# Patient Record
Sex: Male | Born: 1990 | Race: Black or African American | Hispanic: No | Marital: Single | Smoking: Current every day smoker
Health system: Southern US, Community
[De-identification: ages and names within clinical notes are randomized; demographics above are authoritative.]

## PROBLEM LIST (undated history)

## (undated) DIAGNOSIS — J45909 Unspecified asthma, uncomplicated: Secondary | ICD-10-CM

---

## 2017-07-12 ENCOUNTER — Other Ambulatory Visit: Payer: Self-pay

## 2017-07-12 ENCOUNTER — Encounter (HOSPITAL_BASED_OUTPATIENT_CLINIC_OR_DEPARTMENT_OTHER): Payer: Self-pay | Admitting: Emergency Medicine

## 2017-07-12 ENCOUNTER — Emergency Department (HOSPITAL_BASED_OUTPATIENT_CLINIC_OR_DEPARTMENT_OTHER): Payer: Self-pay

## 2017-07-12 ENCOUNTER — Emergency Department (HOSPITAL_BASED_OUTPATIENT_CLINIC_OR_DEPARTMENT_OTHER)
Admission: EM | Admit: 2017-07-12 | Discharge: 2017-07-12 | Disposition: A | Discharge status: Home / Self Care | Payer: Self-pay | Attending: Emergency Medicine | Admitting: Emergency Medicine

## 2017-07-12 DIAGNOSIS — F17200 Nicotine dependence, unspecified, uncomplicated: Secondary | ICD-10-CM | POA: Insufficient documentation

## 2017-07-12 DIAGNOSIS — J4521 Mild intermittent asthma with (acute) exacerbation: Secondary | ICD-10-CM | POA: Insufficient documentation

## 2017-07-12 HISTORY — DX: Unspecified asthma, uncomplicated: J45.909

## 2017-07-12 MED ORDER — ALBUTEROL (5 MG/ML) CONTINUOUS INHALATION SOLN
INHALATION_SOLUTION | RESPIRATORY_TRACT | Status: AC
  Administered 2017-07-12: 3 mg/h
  Filled 2017-07-12: qty 20

## 2017-07-12 MED ORDER — MAGNESIUM SULFATE 2 GM/50ML IV SOLN
2.0000 g | Freq: Once | INTRAVENOUS | Status: AC
  Administered 2017-07-12: 2 g via INTRAVENOUS
  Filled 2017-07-12: qty 50

## 2017-07-12 MED ORDER — ALBUTEROL SULFATE HFA 108 (90 BASE) MCG/ACT IN AERS
1.0000 | INHALATION_SPRAY | Freq: Once | RESPIRATORY_TRACT | Status: AC
  Administered 2017-07-12: 1 via RESPIRATORY_TRACT
  Filled 2017-07-12: qty 6.7

## 2017-07-12 MED ORDER — IPRATROPIUM-ALBUTEROL 0.5-2.5 (3) MG/3ML IN SOLN: 3.0000 mL | Freq: Once | RESPIRATORY_TRACT | Status: DC

## 2017-07-12 MED ORDER — ALBUTEROL SULFATE HFA 108 (90 BASE) MCG/ACT IN AERS
INHALATION_SPRAY | RESPIRATORY_TRACT | Status: AC
  Filled 2017-07-12: qty 6.7

## 2017-07-12 MED ORDER — PREDNISONE 20 MG PO TABS: 40.0000 mg | ORAL_TABLET | Freq: Every day | ORAL | 0 refills | Status: DC

## 2017-07-12 MED ORDER — BENZONATATE 100 MG PO CAPS: 100.0000 mg | ORAL_CAPSULE | Freq: Three times a day (TID) | ORAL | 0 refills | Status: DC

## 2017-07-12 MED ORDER — IPRATROPIUM BROMIDE 0.02 % IN SOLN
RESPIRATORY_TRACT | Status: AC
  Administered 2017-07-12: 0.5 mg
  Filled 2017-07-12: qty 2.5

## 2017-07-12 MED ORDER — PREDNISONE 50 MG PO TABS
60.0000 mg | ORAL_TABLET | Freq: Once | ORAL | Status: AC
  Administered 2017-07-12: 60 mg via ORAL
  Filled 2017-07-12: qty 1

## 2017-07-12 MED ORDER — SODIUM CHLORIDE 0.9 % IV BOLUS
500.0000 mL | Freq: Once | INTRAVENOUS | Status: AC
  Administered 2017-07-12: 500 mL via INTRAVENOUS

## 2017-07-12 MED FILL — BENZONATATE 100 MG CAPSULE: 100 | 5 days supply | Qty: 15 | Fill #0

## 2017-07-12 MED FILL — predniSONE 20 MG TABS: 20 | 4 days supply | Qty: 8 | Fill #0

## 2017-07-12 NOTE — ED Notes (Signed)
Patient transported to X-ray 

## 2017-07-12 NOTE — ED Provider Notes (Addendum)
[facility] EMERGENCY DEPARTMENT Provider Note   CSN: [id number] Arrival date & time: 07/12/17  0930     History   Chief Complaint Chief Complaint  Patient presents with  . Shortness of Breath    HPI Jeff Savage is a 27 y.o. male.  HPI 27 yo male with pmh for asthma presents to the emergency department today for evaluation of asthma exacerbation.  Patient reports chest tightness, shortness of breath, wheezing.  He states for the past 2 months his asthma has been worse however increasingly got worse over the past 2 weeks.  Patient reports using his nebs every hour at rest and 4 times per night.  Patient states that he has had use inhaler every 10 to 20 minutes while at work.  Reports half pack a day smoker.  Also reports that pollen makes his asthma worse.  States he was seen in the ER 1 month ago for an asthma exacerbation treated with steroids which helped.  Patient reports associated congestion, rhinorrhea and productive cough of yellow-green sputum.  Denies any associated hemoptysis, shortness of breath at rest.  He has no primary care doctor that treats him for his asthma.  Patient was treated with a continuous albuterol inhaler prior to my examination states that his breathing has improved.  Patient denies any associated fevers or chills.  No known sick contacts.  Pt denies any fever, chill, ha, vision changes, lightheadedness, dizziness,neck pain, cp, abd pain, n/v/d, urinary symptoms, change in bowel habits, melena, hematochezia, lower extremity paresthesias.   Past Medical History:  Diagnosis Date  . Asthma     There are no active problems to display for this patient.   History reviewed. No pertinent surgical history.      Home Medications    Prior to Admission medications   Not on File    Family History No family history on file.  Social History Social History   Tobacco Use  . Smoking status: Current Every Day Smoker  . Smokeless tobacco:  Never Used  Substance Use Topics  . Alcohol use: Never    Frequency: Never  . Drug use: Never     Allergies   Patient has no known allergies.   Review of Systems Review of Systems  All other systems reviewed and are negative.    Physical Exam Updated Vital Signs BP (!) 144/97 (BP Location: Right Arm)   Pulse 97   Temp 99.6 F (37.6 C) (Oral)   Resp 16   Ht  (1.753 m)   Wt 87.5 kg (193 lb)   SpO2 96%   BMI 28.50 kg/m   Physical Exam  Constitutional: He is oriented to person, place, and time. He appears well-developed and well-nourished.  Non-toxic appearance. No distress.  HENT:  Head: Normocephalic and atraumatic.  Nose: Nose normal.  Mouth/Throat: Oropharynx is clear and moist.  Eyes: Pupils are equal, round, and reactive to light. Conjunctivae are normal. Right eye exhibits no discharge. Left eye exhibits no discharge.  Neck: Normal range of motion. Neck supple. No JVD present. No tracheal deviation present.  Cardiovascular: Normal rate, regular rhythm, normal heart sounds and intact distal pulses.  Pulmonary/Chest: Effort normal. No accessory muscle usage. No tachypnea. No respiratory distress. He has wheezes. He exhibits no tenderness.  No increased work of breathing.  No significant tachypnea noted.  Abdominal: Soft. Bowel sounds are normal. He exhibits no distension. There is no tenderness. There is no rebound and no guarding.  Musculoskeletal: Normal range of  motion.       Right lower leg: Normal. He exhibits no tenderness and no edema.       Left lower leg: Normal. He exhibits no tenderness and no edema.  No lower extremity edema or calf tenderness.  Lymphadenopathy:    He has no cervical adenopathy.  Neurological: He is alert and oriented to person, place, and time.  Skin: Skin is warm and dry. Capillary refill takes less than 2 seconds. He is not diaphoretic.  Psychiatric: His behavior is normal. Judgment and thought content normal.  Nursing note and  vitals reviewed.    ED Treatments / Results  Labs (all labs ordered are listed, but only abnormal results are displayed) Labs Reviewed - No data to display  EKG None  Radiology Dg Chest 2 View  Result Date: 07/12/2017 CLINICAL DATA:  Asthma EXAM: CHEST - 2 VIEW COMPARISON:  03/22/2017 FINDINGS: Heart and mediastinal contours are within normal limits. No focal opacities or effusions. No acute bony abnormality. IMPRESSION: No active cardiopulmonary disease. Electronically Signed   By: Charlett Nose M.D.   On: 07/12/2017 11:13    Procedures Procedures (including critical care time)  Medications Ordered in ED Medications  ipratropium-albuterol (DUONEB) 0.5-2.5 (3) MG/3ML nebulizer solution 3 mL (3 mLs Nebulization Not Given 07/12/17 1110)  magnesium sulfate IVPB 2 g 50 mL (has no administration in time range)  sodium chloride 0.9 % bolus 500 mL (has no administration in time range)  ipratropium (ATROVENT) 0.02 % nebulizer solution (0.5 mg  Given by Other 07/12/17 0942)  albuterol (PROVENTIL, VENTOLIN) (5 MG/ML) 0.5% continuous inhalation solution (3 mg/hr  Given 07/12/17 0942)  predniSONE (DELTASONE) tablet 60 mg (60 mg Oral Given 07/12/17 1106)     Initial Impression / Assessment and Plan / ED Course  I have reviewed the triage vital signs and the nursing notes.  Pertinent labs & imaging results that were available during my care of the patient were reviewed by me and considered in my medical decision making (see chart for details).     Patient ambulated in ED with O2 saturations maintained >90, no current signs of respiratory distress. Lung exam improved after nebulizer treatment. Prednisone given in the ED and pt will bd dc with 5 day burst. Pt states they are breathing at baseline. Pt has been instructed to continue using prescribed medications and to speak with PCP about today's exacerbation.   Pt is hemodynamically stable, in NAD, & able to ambulate in the ED. Evaluation does  not show pathology that would require ongoing emergent intervention or inpatient treatment.  Presentation does not seem consistent with PE, dissection, ACS, pneumonia.  X-ray notes findings for acute infiltrate concerning for pneumonia.  I explained the diagnosis to the patient. Pain has been managed & has no complaints prior to dc. Pt is comfortable with above plan and is stable for discharge at this time. All questions were answered prior to disposition. Strict return precautions for f/u to the ED were discussed. Encouraged follow up with PCP.    Final Clinical Impressions(s) / ED Diagnoses   Final diagnoses:  Mild intermittent asthma with exacerbation    ED Discharge Orders        Ordered    predniSONE (DELTASONE) 20 MG tablet  Daily with breakfast     07/12/17 1340    benzonatate (TESSALON) 100 MG capsule  Every 8 hours     07/12/17 1340       Rise Mu, [place] 07/12/17 1342  Rise Mu, PA-C 07/12/17 1343    Rolan Bucco, MD 07/12/17 [phone]

## 2017-07-12 NOTE — ED Notes (Signed)
Pt ambulated here in the ED with any respiratory distress noted. Patient laughing and talking with no increase WOB noted. HR 107, RR 18, SpO2 between 94-97% on RA. Pt stated "im feeling better and ready to go home".

## 2017-07-12 NOTE — ED Notes (Signed)
  Albuterol/ 0.5mg  Atrovent Continuous complete.

## 2017-07-12 NOTE — Discharge Instructions (Addendum)
You have been treated for an asthma attack in the ED. Take prednisone daily for 4 days starting tomorrow morning.   I have given you a refill of your home inhaler.   You may use the tessalon for cough.   Warm steam from a hot shower will help your symptoms.   Follow up with your primary care provider for further discussion of today's ER visit.   If you develop any new or worsening symptoms, including but not limited to fever, persistent vomiting, worsening shortness of breath or other symptoms that concern you, please return to the Emergency Department immediately.  

## 2017-07-12 NOTE — ED Triage Notes (Signed)
PT presents with c/o shortness of breath and has history of asthma , pt reports he has been using his nebs and inhaler at home but no relief and ran out of inhaler yesterday.

## 2017-08-07 ENCOUNTER — Encounter (HOSPITAL_BASED_OUTPATIENT_CLINIC_OR_DEPARTMENT_OTHER): Payer: Self-pay | Admitting: Emergency Medicine

## 2017-08-07 ENCOUNTER — Other Ambulatory Visit: Payer: Self-pay

## 2017-08-07 ENCOUNTER — Emergency Department (HOSPITAL_BASED_OUTPATIENT_CLINIC_OR_DEPARTMENT_OTHER)
Admission: EM | Admit: 2017-08-07 | Discharge: 2017-08-07 | Disposition: A | Discharge status: Home / Self Care | Payer: Self-pay | Attending: Emergency Medicine | Admitting: Emergency Medicine

## 2017-08-07 ENCOUNTER — Emergency Department (HOSPITAL_BASED_OUTPATIENT_CLINIC_OR_DEPARTMENT_OTHER): Payer: Self-pay

## 2017-08-07 DIAGNOSIS — J4521 Mild intermittent asthma with (acute) exacerbation: Secondary | ICD-10-CM | POA: Insufficient documentation

## 2017-08-07 DIAGNOSIS — Z79899 Other long term (current) drug therapy: Secondary | ICD-10-CM | POA: Insufficient documentation

## 2017-08-07 DIAGNOSIS — F17200 Nicotine dependence, unspecified, uncomplicated: Secondary | ICD-10-CM | POA: Insufficient documentation

## 2017-08-07 MED ORDER — PREDNISONE 10 MG (21) PO TBPK: ORAL_TABLET | Freq: Every day | ORAL | 0 refills | Status: DC

## 2017-08-07 MED ORDER — IPRATROPIUM-ALBUTEROL 0.5-2.5 (3) MG/3ML IN SOLN
3.0000 mL | RESPIRATORY_TRACT | Status: AC
  Administered 2017-08-07 (×3): 3 mL via RESPIRATORY_TRACT

## 2017-08-07 MED ORDER — PREDNISONE 50 MG PO TABS
60.0000 mg | ORAL_TABLET | Freq: Once | ORAL | Status: AC
  Administered 2017-08-07: 60 mg via ORAL
  Filled 2017-08-07: qty 1

## 2017-08-07 MED ORDER — IPRATROPIUM-ALBUTEROL 0.5-2.5 (3) MG/3ML IN SOLN
RESPIRATORY_TRACT | Status: AC
  Administered 2017-08-07: 3 mL via RESPIRATORY_TRACT
  Filled 2017-08-07: qty 9

## 2017-08-07 MED ORDER — ALBUTEROL SULFATE HFA 108 (90 BASE) MCG/ACT IN AERS
1.0000 | INHALATION_SPRAY | Freq: Once | RESPIRATORY_TRACT | Status: AC
  Administered 2017-08-07: 1 via RESPIRATORY_TRACT
  Filled 2017-08-07: qty 6.7

## 2017-08-07 MED ORDER — ALBUTEROL (5 MG/ML) CONTINUOUS INHALATION SOLN
INHALATION_SOLUTION | RESPIRATORY_TRACT | Status: AC
  Administered 2017-08-07: 3 mg/h
  Filled 2017-08-07: qty 20

## 2017-08-07 NOTE — ED Notes (Signed)
ED Provider at bedside. 

## 2017-08-07 NOTE — ED Notes (Signed)
SPO2 while ambulating 97% and HR 130. Pt states "I feel better than before"

## 2017-08-07 NOTE — ED Provider Notes (Signed)
[facility] EMERGENCY DEPARTMENT Provider Note   CSN: [id number] Arrival date & time: 08/07/17  1509     History   Chief Complaint Chief Complaint  Patient presents with  . Asthma    HPI Jeff Savage is a 27 y.o. male with history of asthma who presents with shortness of breath, wheezing, chest tightness that became worse this morning.  Patient has had intermittent symptoms for the past 2 weeks.  He was seen here 3 to 4 weeks ago and given prednisone and he states when he finished, his symptoms returned.  He denies any fevers at home, chest pain, abdominal pain, nausea, vomiting.  He is out of his inhaler.  HPI  Past Medical History:  Diagnosis Date  . Asthma     There are no active problems to display for this patient.   History reviewed. No pertinent surgical history.      Home Medications    Prior to Admission medications   Medication Sig Start Date End Date Taking? Authorizing Provider  albuterol (PROVENTIL HFA;VENTOLIN HFA) 108 (90 Base) MCG/ACT inhaler Inhale into the lungs. 11/03/16  Yes [provider]  albuterol (PROVENTIL) (2.5 MG/3ML) 0.083% nebulizer solution Take 3 mLs (2.5 mg total) by nebulization every 4 (four) hours as needed for Wheezing. 11/03/16  Yes [provider]  benzonatate (TESSALON) 100 MG capsule Take 1 capsule (100 mg total) by mouth every 8 (eight) hours. 07/12/17   Rise MuLeaphart, Kenneth T, PA-C  predniSONE (STERAPRED UNI-PAK 21 TAB) 10 MG (21) TBPK tablet Take by mouth daily. Take 6 tabs by mouth daily  for 2 days, then 5 tabs for 2 days, then 4 tabs for 2 days, then 3 tabs for 2 days, 2 tabs for 2 days, then 1 tab by mouth daily for 2 days 08/07/17   Emi HolesLaw, Abbygail [place] M, PA-C    Family History No family history on file.  Social History Social History   Tobacco Use  . Smoking status: Current Every Day Smoker    Packs/day: 0.50  . Smokeless tobacco: Never Used  Substance Use Topics  . Alcohol use: Never   Frequency: Never  . Drug use: Never     Allergies   Patient has no known allergies.   Review of Systems Review of Systems  Constitutional: Negative for chills and fever.  HENT: Negative for facial swelling and sore throat.   Respiratory: Positive for cough, chest tightness, shortness of breath and wheezing.   Cardiovascular: Negative for chest pain.  Gastrointestinal: Negative for abdominal pain, nausea and vomiting.  Genitourinary: Negative for dysuria.  Musculoskeletal: Negative for back pain.  Skin: Negative for rash and wound.  Neurological: Negative for headaches.  Psychiatric/Behavioral: The patient is not nervous/anxious.      Physical Exam Updated Vital Signs BP (!) 140/91 (BP Location: Right Arm)   Pulse (!) 132   Temp 98.2 F (36.8 C) (Oral)   Resp 16   Ht 5\' 9"  (1.753 m)   Wt 88.9 kg (196 lb)   SpO2 97%   BMI 28.94 kg/m   Physical Exam  Constitutional: He appears well-developed and well-nourished. No distress.  HENT:  Head: Normocephalic and atraumatic.  Mouth/Throat: Oropharynx is clear and moist. No oropharyngeal exudate.  Eyes: Pupils are equal, round, and reactive to light. Conjunctivae are normal. Right eye exhibits no discharge. Left eye exhibits no discharge. No scleral icterus.  Neck: Normal range of motion. Neck supple. No thyromegaly present.  Cardiovascular: Regular rhythm, normal heart sounds and intact  distal pulses. Exam reveals no gallop and no friction rub.  No murmur heard. Pulmonary/Chest: Effort normal. No stridor. No respiratory distress. He has wheezes (expiratory bilaterally). He has no rales.  Abdominal: Soft. Bowel sounds are normal. He exhibits no distension. There is no tenderness. There is no rebound and no guarding.  Musculoskeletal: He exhibits no edema.  Lymphadenopathy:    He has no cervical adenopathy.  Neurological: He is alert. Coordination normal.  Skin: Skin is warm and dry. No rash noted. He is not diaphoretic. No  pallor.  Psychiatric: He has a normal mood and affect.  Nursing note and vitals reviewed.    ED Treatments / Results  Labs (all labs ordered are listed, but only abnormal results are displayed) Labs Reviewed - No data to display  EKG None  Radiology Dg Chest 2 View  Result Date: 08/07/2017 CLINICAL DATA:  27 year old male with history of shortness of breath. EXAM: CHEST - 2 VIEW COMPARISON:  Chest x-ray 07/12/2017. FINDINGS: Lung volumes are normal. No consolidative airspace disease. No pleural effusions. No pneumothorax. No pulmonary nodule or mass noted. Pulmonary vasculature and the cardiomediastinal silhouette are within normal limits. IMPRESSION: No radiographic evidence of acute cardiopulmonary disease. Electronically Signed   By: Trudie Reed M.D.   On: 08/07/2017 16:32    Procedures Procedures (including critical care time)  Medications Ordered in ED Medications  albuterol (PROVENTIL HFA;VENTOLIN HFA) 108 (90 Base) MCG/ACT inhaler 1 puff (has no administration in time range)  ipratropium-albuterol (DUONEB) 0.5-2.5 (3) MG/3ML nebulizer solution 3 mL (3 mLs Nebulization Given 08/07/17 1604)  predniSONE (DELTASONE) tablet 60 mg (60 mg Oral Given 08/07/17 1611)  albuterol (PROVENTIL, VENTOLIN) (5 MG/ML) 0.5% continuous inhalation solution (3 mg/hr  Given 08/07/17 1610)     Initial Impression / Assessment and Plan / ED Course  I have reviewed the triage vital signs and the nursing notes.  Pertinent labs & imaging results that were available during my care of the patient were reviewed by me and considered in my medical decision making (see chart for details).  Clinical Course as of Aug 07 1800  Sun Aug 07, 2017  1605 Patient given 3 Duonebs on arrival and states his chest tightness is improving, but still not back to baseline. His lung exam is about the same as initial- expiratory wheezing throughout. Will proceed with continuous neb.   [AL]  1738 On reevaluation after  continuous neb, patient feels like he is breathing back to baseline. His repeat lung exam is much improved with only fait wheezing heard in the R upper lung fields. Will check pulseox with ambulation and plan for discharge if no desats.   [AL]    Clinical Course User Index [AL] Emi Holes, PA-C    Patient with asthma exacerbation.  After 3 duo nebs and continuous albuterol in the ED, patient is feeling much better back to baseline.  He is ambulating at 97% on room air and states he feels much better.  Chest x-ray is negative.  Patient given prednisone in the ED and will be discharged home with taper considering his symptoms rebounded after he finishes prednisone last time.  Patient advised to establish care with a primary care provider, as maintenance medication may be beneficial for him.  Return precautions discussed.  Patient understands and agrees with plan.  Patient vitals stable throughout ED course and discharged in satisfactory condition.  Final Clinical Impressions(s) / ED Diagnoses   Final diagnoses:  Mild intermittent asthma with exacerbation  ED Discharge Orders        Ordered    predniSONE (STERAPRED UNI-PAK 21 TAB) 10 MG (21) TBPK tablet  Daily     08/07/17 1758       Emi Holes, PA-C 08/07/17 1802    Mesner, Barbara Cower, MD 08/07/17 1954

## 2017-08-07 NOTE — Discharge Instructions (Signed)
Medications: prednisone, albuterol inhaler  Treatment: Take prednisone until completed.  Use albuterol inhaler every 4-6 hours as needed for chest tightness, wheezing, shortness of breath.  Follow-up: Please follow-up and establish care with a primary care provider by calling the number below for further management  of your asthma.  You may benefit from being placed on maintenance medication.  Please return to the emergency department if you develop any new or worsening symptoms.

## 2017-08-07 NOTE — ED Triage Notes (Signed)
SOB, out of inhaler. Does not have a PCP

## 2017-09-22 ENCOUNTER — Encounter (HOSPITAL_BASED_OUTPATIENT_CLINIC_OR_DEPARTMENT_OTHER): Payer: Self-pay

## 2017-09-22 ENCOUNTER — Other Ambulatory Visit: Payer: Self-pay

## 2017-09-22 ENCOUNTER — Emergency Department (HOSPITAL_BASED_OUTPATIENT_CLINIC_OR_DEPARTMENT_OTHER): Payer: Self-pay

## 2017-09-22 ENCOUNTER — Emergency Department (HOSPITAL_BASED_OUTPATIENT_CLINIC_OR_DEPARTMENT_OTHER)
Admission: EM | Admit: 2017-09-22 | Discharge: 2017-09-23 | Disposition: A | Discharge status: Home / Self Care | Payer: Self-pay | Attending: Emergency Medicine | Admitting: Emergency Medicine

## 2017-09-22 DIAGNOSIS — Z79899 Other long term (current) drug therapy: Secondary | ICD-10-CM | POA: Insufficient documentation

## 2017-09-22 DIAGNOSIS — F17200 Nicotine dependence, unspecified, uncomplicated: Secondary | ICD-10-CM | POA: Insufficient documentation

## 2017-09-22 DIAGNOSIS — J4541 Moderate persistent asthma with (acute) exacerbation: Secondary | ICD-10-CM | POA: Insufficient documentation

## 2017-09-22 MED ORDER — IPRATROPIUM-ALBUTEROL 0.5-2.5 (3) MG/3ML IN SOLN
RESPIRATORY_TRACT | Status: AC
  Administered 2017-09-22: 3 mL
  Filled 2017-09-22: qty 3

## 2017-09-22 MED ORDER — ALBUTEROL SULFATE (2.5 MG/3ML) 0.083% IN NEBU
2.5000 mg | INHALATION_SOLUTION | Freq: Once | RESPIRATORY_TRACT | Status: AC
  Administered 2017-09-22: 2.5 mg via RESPIRATORY_TRACT
  Filled 2017-09-22: qty 3

## 2017-09-22 MED ORDER — IPRATROPIUM-ALBUTEROL 0.5-2.5 (3) MG/3ML IN SOLN
3.0000 mL | Freq: Once | RESPIRATORY_TRACT | Status: DC
  Filled 2017-09-22: qty 3

## 2017-09-22 NOTE — ED Provider Notes (Signed)
[facility] EMERGENCY DEPARTMENT Provider Note   CSN: [id number] Arrival date & time: 09/22/17  2323     History   Chief Complaint Chief Complaint  Patient presents with  . Shortness of Breath    HPI Jeff Savage is a 27 y.o. male.  Patient presents to the emergency department for evaluation of difficulty breathing.  Patient reports that he has been experiencing progressive shortness of breath and wheezing over the last several days.  Symptoms worsened tonight.  He has run out of his inhaler, has not used any breathing treatments today.  He has a cough which is dry, nonproductive.  He has not noticed any fevers.  Was hospitalized in May for asthma.  He has never been intubated.     Past Medical History:  Diagnosis Date  . Asthma     There are no active problems to display for this patient.   History reviewed. No pertinent surgical history.      Home Medications    Prior to Admission medications   Medication Sig Start Date End Date Taking? Authorizing Provider  albuterol (PROVENTIL HFA;VENTOLIN HFA) 108 (90 Base) MCG/ACT inhaler Inhale into the lungs. 11/03/16  Yes [provider]  albuterol (PROVENTIL) (2.5 MG/3ML) 0.083% nebulizer solution Take 3 mLs (2.5 mg total) by nebulization every 4 (four) hours as needed for wheezing or shortness of breath. 09/23/17   Gilda Crease, MD  predniSONE (DELTASONE) 20 MG tablet Take 3 tablets (60 mg total) by mouth daily with breakfast. 09/23/17   Onesha Krebbs, Canary Brim, MD    Family History No family history on file.  Social History Social History   Tobacco Use  . Smoking status: Current Every Day Smoker    Packs/day: 0.50  . Smokeless tobacco: Never Used  Substance Use Topics  . Alcohol use: Never    Frequency: Never  . Drug use: Never     Allergies   Patient has no known allergies.   Review of Systems Review of Systems  Respiratory: Positive for cough, shortness of breath and wheezing.    All other systems reviewed and are negative.    Physical Exam Updated Vital Signs BP (!) 121/91 (BP Location: Right Arm)   Pulse (!) 120   Temp 98.3 F (36.8 C) (Oral)   Resp (!) 27   Ht 5\' 9"  (1.753 m)   Wt 88.9 kg   SpO2 94%   BMI 28.94 kg/m   Physical Exam  Constitutional: He is oriented to person, place, and time. He appears well-developed and well-nourished. No distress.  HENT:  Head: Normocephalic and atraumatic.  Right Ear: Hearing normal.  Left Ear: Hearing normal.  Nose: Nose normal.  Mouth/Throat: Oropharynx is clear and moist and mucous membranes are normal.  Eyes: Pupils are equal, round, and reactive to light. Conjunctivae and EOM are normal.  Neck: Normal range of motion. Neck supple.  Cardiovascular: Regular rhythm, S1 normal and S2 normal. Exam reveals no gallop and no friction rub.  No murmur heard. Pulmonary/Chest: Accessory muscle usage present. Tachypnea noted. He has decreased breath sounds. He has wheezes. He exhibits no tenderness.  Abdominal: Soft. Normal appearance and bowel sounds are normal. There is no hepatosplenomegaly. There is no tenderness. There is no rebound, no guarding, no tenderness at McBurney's point and negative Murphy's sign. No hernia.  Musculoskeletal: Normal range of motion.  Neurological: He is alert and oriented to person, place, and time. He has normal strength. No cranial nerve deficit or sensory deficit. Coordination  normal. GCS eye subscore is 4. GCS verbal subscore is 5. GCS motor subscore is 6.  Skin: Skin is warm, dry and intact. No rash noted. No cyanosis.  Psychiatric: He has a normal mood and affect. His speech is normal and behavior is normal. Thought content normal.  Nursing note and vitals reviewed.    ED Treatments / Results  Labs (all labs ordered are listed, but only abnormal results are displayed) Labs Reviewed - No data to display  EKG None  Radiology Dg Chest 2 View  Result Date: 09/23/2017 CLINICAL  DATA:  Shortness of breath EXAM: CHEST - 2 VIEW COMPARISON:  08/07/2017 FINDINGS: The heart size and mediastinal contours are within normal limits. Both lungs are clear. The visualized skeletal structures are unremarkable. IMPRESSION: No active cardiopulmonary disease. Electronically Signed   By: Deatra RobinsonKevin  Herman M.D.   On: 09/23/2017 00:41    Procedures Procedures (including critical care time)  Medications Ordered in ED Medications  ipratropium-albuterol (DUONEB) 0.5-2.5 (3) MG/3ML nebulizer solution 3 mL (3 mLs Nebulization Not Given 09/22/17 2332)  albuterol (PROVENTIL HFA;VENTOLIN HFA) 108 (90 Base) MCG/ACT inhaler 2 puff (has no administration in time range)  albuterol (PROVENTIL) (2.5 MG/3ML) 0.083% nebulizer solution 2.5 mg (2.5 mg Nebulization Given 09/22/17 2332)  ipratropium-albuterol (DUONEB) 0.5-2.5 (3) MG/3ML nebulizer solution (3 mLs  Given 09/22/17 2339)  albuterol (PROVENTIL) (2.5 MG/3ML) 0.083% nebulizer solution 2.5 mg (2.5 mg Nebulization Given 09/22/17 2358)  predniSONE (DELTASONE) tablet 60 mg (60 mg Oral Given 09/23/17 0023)  albuterol (PROVENTIL) (2.5 MG/3ML) 0.083% nebulizer solution 2.5 mg (2.5 mg Nebulization Given 09/23/17 0012)     Initial Impression / Assessment and Plan / ED Course  I have reviewed the triage vital signs and the nursing notes.  Pertinent labs & imaging results that were available during my care of the patient were reviewed by me and considered in my medical decision making (see chart for details).     Patient presents to the emergency department for evaluation of asthma exacerbation.  Patient reports that he has run out of his albuterol.  He did have moderate bronchospasm upon arrival but is moving air well and oxygenating.  Patient has had improvement with serial nebulizer treatments.  Chest x-ray does not show any evidence of pneumonia.  Patient will be discharged, continue albuterol (even an inhaler at discharge) and a course of prednisone.  He will return  if he has worsening symptoms.  Final Clinical Impressions(s) / ED Diagnoses   Final diagnoses:  Moderate persistent asthma with acute exacerbation    ED Discharge Orders         Ordered    predniSONE (DELTASONE) 20 MG tablet  Daily with breakfast     09/23/17 0139    albuterol (PROVENTIL) (2.5 MG/3ML) 0.083% nebulizer solution  Every 4 hours PRN     09/23/17 0139           Gilda CreasePollina, Raenell Mensing J, MD 09/23/17 0139

## 2017-09-22 NOTE — ED Notes (Signed)
ED Provider at bedside. 

## 2017-09-22 NOTE — ED Triage Notes (Signed)
Pt presents with labored breathing, ins/exp breathing, SpO2.

## 2017-09-23 MED ORDER — ALBUTEROL SULFATE (2.5 MG/3ML) 0.083% IN NEBU
2.5000 mg | INHALATION_SOLUTION | Freq: Once | RESPIRATORY_TRACT | Status: AC
  Administered 2017-09-23: 2.5 mg via RESPIRATORY_TRACT
  Filled 2017-09-23: qty 3

## 2017-09-23 MED ORDER — ALBUTEROL SULFATE HFA 108 (90 BASE) MCG/ACT IN AERS
2.0000 | INHALATION_SPRAY | RESPIRATORY_TRACT | Status: DC | PRN
  Administered 2017-09-23: 2 via RESPIRATORY_TRACT
  Filled 2017-09-23: qty 6.7

## 2017-09-23 MED ORDER — PREDNISONE 10 MG PO TABS
60.0000 mg | ORAL_TABLET | Freq: Once | ORAL | Status: AC
  Administered 2017-09-23: 60 mg via ORAL
  Filled 2017-09-23: qty 1

## 2017-09-23 MED ORDER — ALBUTEROL SULFATE (2.5 MG/3ML) 0.083% IN NEBU: 2.5000 mg | INHALATION_SOLUTION | RESPIRATORY_TRACT | 3 refills | Status: DC | PRN

## 2017-09-23 MED ORDER — PREDNISONE 20 MG PO TABS: 60.0000 mg | ORAL_TABLET | Freq: Every day | ORAL | 0 refills | Status: DC

## 2017-10-09 ENCOUNTER — Other Ambulatory Visit: Payer: Self-pay

## 2017-10-09 ENCOUNTER — Encounter (HOSPITAL_BASED_OUTPATIENT_CLINIC_OR_DEPARTMENT_OTHER): Payer: Self-pay | Admitting: *Deleted

## 2017-10-09 ENCOUNTER — Emergency Department (HOSPITAL_BASED_OUTPATIENT_CLINIC_OR_DEPARTMENT_OTHER)
Admission: EM | Admit: 2017-10-09 | Discharge: 2017-10-10 | Disposition: A | Discharge status: Home / Self Care | Payer: No Typology Code available for payment source | Attending: Emergency Medicine | Admitting: Emergency Medicine

## 2017-10-09 DIAGNOSIS — Z79899 Other long term (current) drug therapy: Secondary | ICD-10-CM | POA: Diagnosis not present

## 2017-10-09 DIAGNOSIS — F17210 Nicotine dependence, cigarettes, uncomplicated: Secondary | ICD-10-CM | POA: Insufficient documentation

## 2017-10-09 DIAGNOSIS — Y92410 Unspecified street and highway as the place of occurrence of the external cause: Secondary | ICD-10-CM | POA: Diagnosis not present

## 2017-10-09 DIAGNOSIS — S161XXA Strain of muscle, fascia and tendon at neck level, initial encounter: Secondary | ICD-10-CM | POA: Insufficient documentation

## 2017-10-09 DIAGNOSIS — Y9389 Activity, other specified: Secondary | ICD-10-CM | POA: Insufficient documentation

## 2017-10-09 DIAGNOSIS — Y998 Other external cause status: Secondary | ICD-10-CM | POA: Insufficient documentation

## 2017-10-09 DIAGNOSIS — S199XXA Unspecified injury of neck, initial encounter: Secondary | ICD-10-CM | POA: Diagnosis present

## 2017-10-09 DIAGNOSIS — S8992XA Unspecified injury of left lower leg, initial encounter: Secondary | ICD-10-CM | POA: Diagnosis not present

## 2017-10-09 DIAGNOSIS — J45909 Unspecified asthma, uncomplicated: Secondary | ICD-10-CM | POA: Insufficient documentation
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
## 2017-10-10 ENCOUNTER — Emergency Department (HOSPITAL_BASED_OUTPATIENT_CLINIC_OR_DEPARTMENT_OTHER): Payer: No Typology Code available for payment source

## 2017-10-10 MED ORDER — NAPROXEN 250 MG PO TABS
500.0000 mg | ORAL_TABLET | Freq: Once | ORAL | Status: AC
  Administered 2017-10-10: 500 mg via ORAL
  Filled 2017-10-10: qty 2

## 2017-10-10 MED ORDER — NAPROXEN 375 MG PO TABS: ORAL_TABLET | ORAL | 0 refills | Status: DC

## 2017-10-10 MED ORDER — HYDROCODONE-ACETAMINOPHEN 5-325 MG PO TABS: 1.0000 | ORAL_TABLET | ORAL | 0 refills | Status: DC | PRN

## 2017-10-10 MED ORDER — HYDROCODONE-ACETAMINOPHEN 5-325 MG PO TABS
1.0000 | ORAL_TABLET | Freq: Once | ORAL | Status: AC
  Administered 2017-10-10: 1 via ORAL
  Filled 2017-10-10: qty 1
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
## 2017-10-10 NOTE — ED Notes (Signed)
PMS intact before and after. Pt tolerated well. All questions answered. 

## 2018-12-08 IMAGING — CR DG CHEST 2V
2 series · 2 of 2 positions shown · non-contrast
Comparison: Chest x-ray 07/12/2017.

CLINICAL DATA: 27-year-old male with history of shortness of
breath.

EXAM:
CHEST - 2 VIEW

[w chest pa]
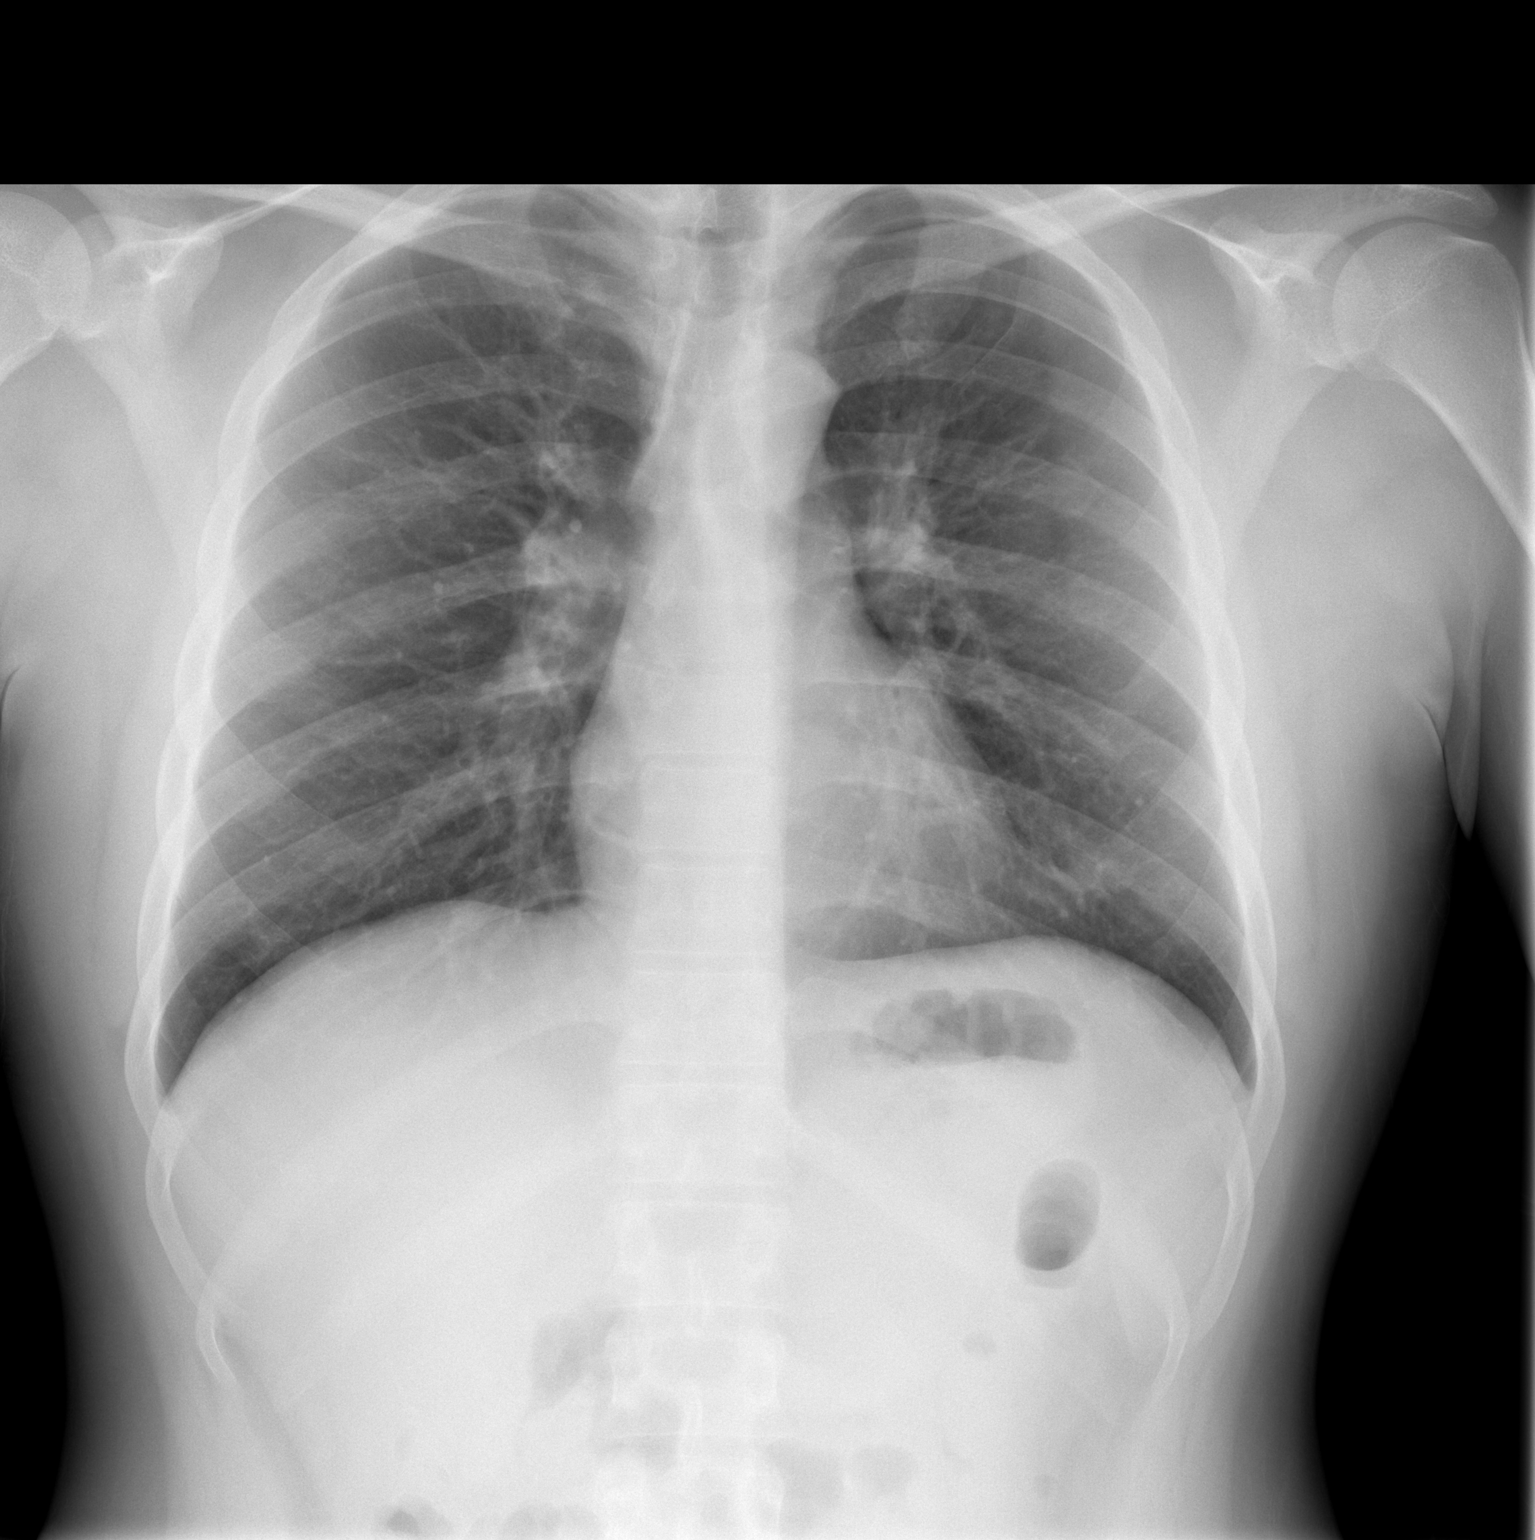

[w chest lat]
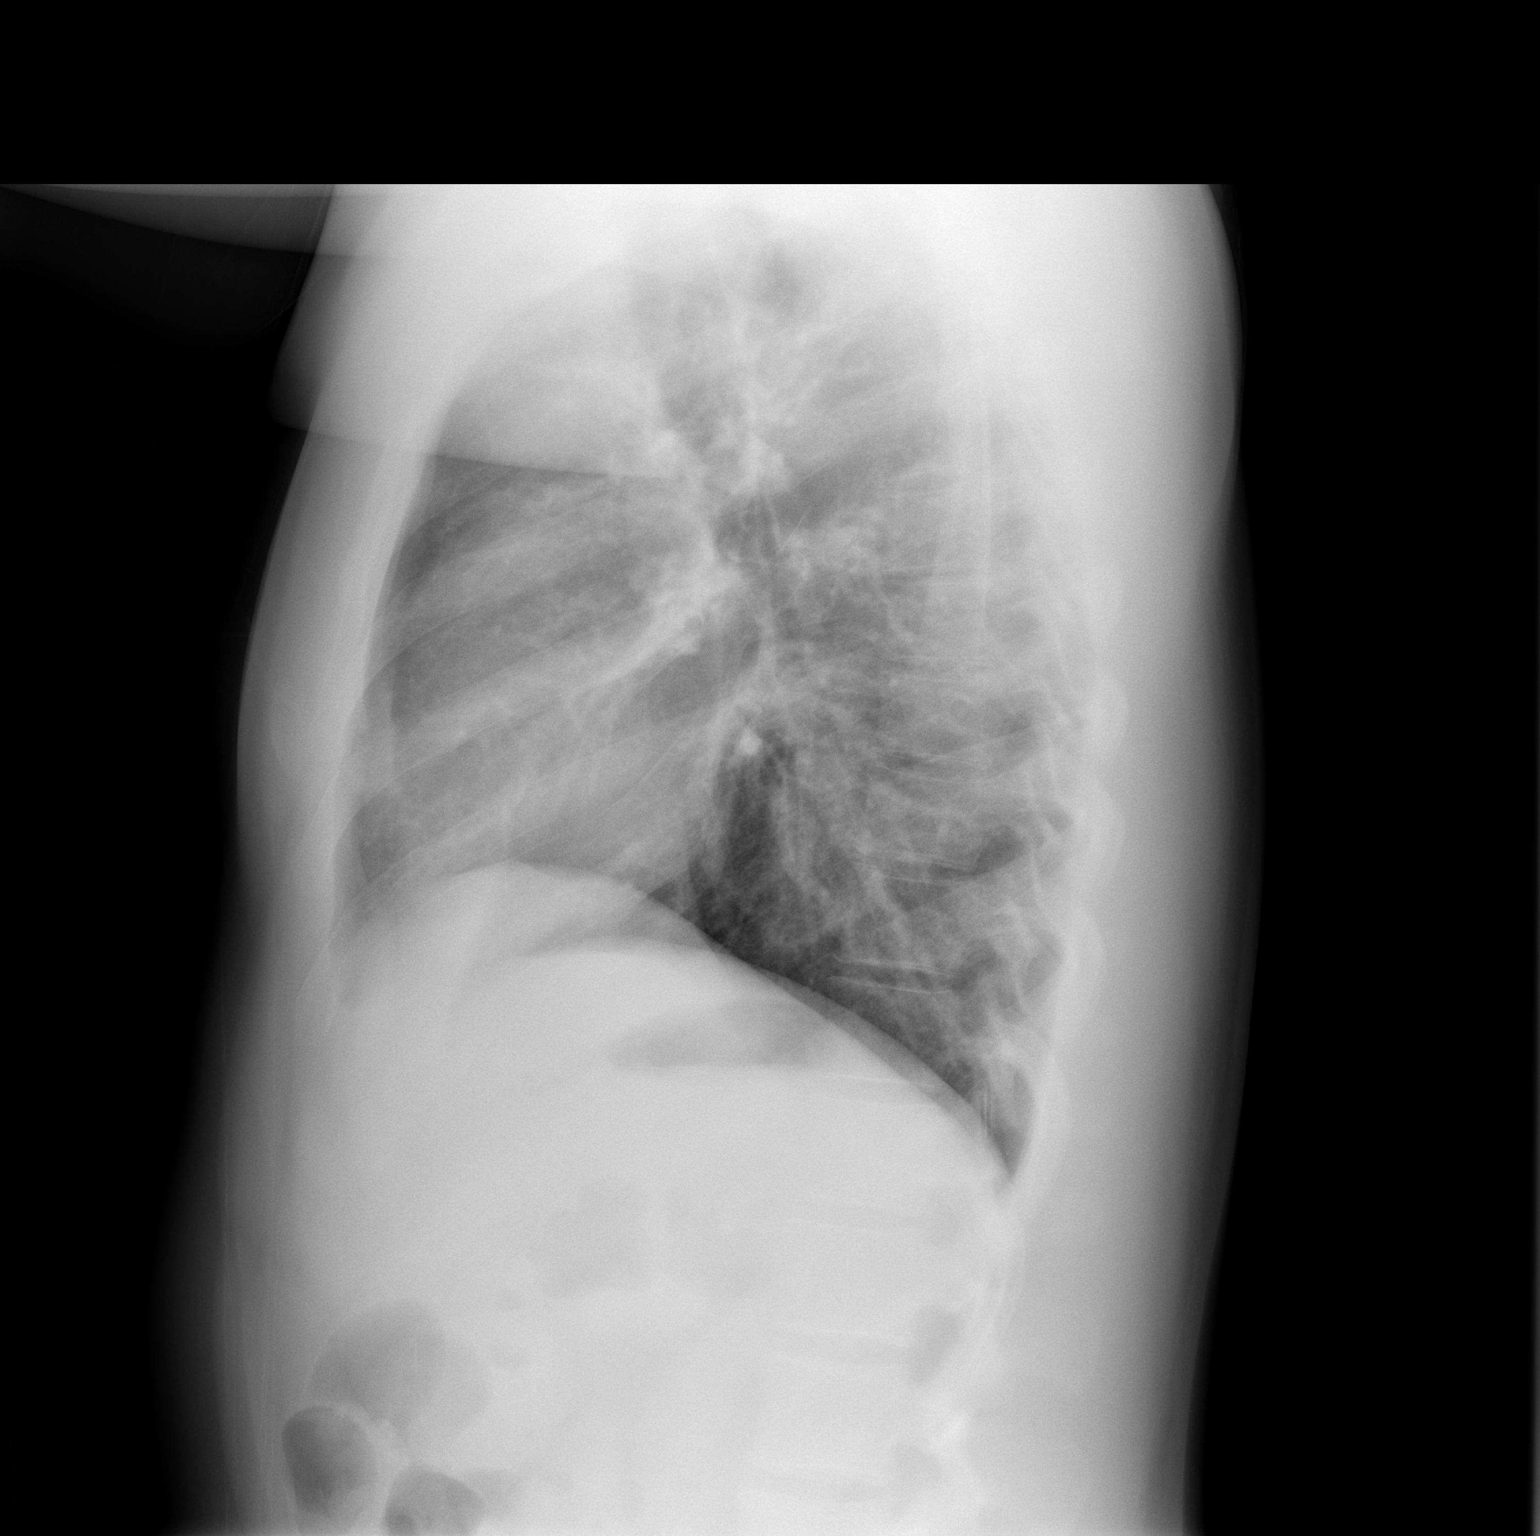

[2 of 2 positions shown; findings below may reference images not displayed]

FINDINGS: Lung volumes are normal. No consolidative airspace disease. No
pleural effusions. No pneumothorax. No pulmonary nodule or mass
noted. Pulmonary vasculature and the cardiomediastinal silhouette
are within normal limits.
IMPRESSION: No radiographic evidence of acute cardiopulmonary disease.

## 2018-12-28 ENCOUNTER — Encounter (HOSPITAL_BASED_OUTPATIENT_CLINIC_OR_DEPARTMENT_OTHER): Payer: Self-pay | Admitting: *Deleted

## 2018-12-28 ENCOUNTER — Emergency Department (HOSPITAL_BASED_OUTPATIENT_CLINIC_OR_DEPARTMENT_OTHER)
Admission: EM | Admit: 2018-12-28 | Discharge: 2018-12-28 | Disposition: A | Discharge status: Home / Self Care | Payer: Self-pay | Attending: Emergency Medicine | Admitting: Emergency Medicine

## 2018-12-28 ENCOUNTER — Other Ambulatory Visit: Payer: Self-pay

## 2018-12-28 DIAGNOSIS — J4541 Moderate persistent asthma with (acute) exacerbation: Secondary | ICD-10-CM | POA: Insufficient documentation

## 2018-12-28 DIAGNOSIS — F17210 Nicotine dependence, cigarettes, uncomplicated: Secondary | ICD-10-CM | POA: Insufficient documentation

## 2018-12-28 DIAGNOSIS — J45901 Unspecified asthma with (acute) exacerbation: Secondary | ICD-10-CM

## 2018-12-28 MED ORDER — PREDNISONE 20 MG PO TABS: 40.0000 mg | ORAL_TABLET | Freq: Every day | ORAL | 0 refills | Status: DC

## 2018-12-28 MED ORDER — ALBUTEROL SULFATE HFA 108 (90 BASE) MCG/ACT IN AERS
8.0000 | INHALATION_SPRAY | Freq: Once | RESPIRATORY_TRACT | Status: AC
  Administered 2018-12-28: 13:00:00 8 via RESPIRATORY_TRACT

## 2018-12-28 MED ORDER — ALBUTEROL SULFATE HFA 108 (90 BASE) MCG/ACT IN AERS
8.0000 | INHALATION_SPRAY | Freq: Once | RESPIRATORY_TRACT | Status: AC
  Administered 2018-12-28: 15:00:00 8 via RESPIRATORY_TRACT

## 2018-12-28 MED ORDER — IPRATROPIUM BROMIDE HFA 17 MCG/ACT IN AERS
4.0000 | INHALATION_SPRAY | Freq: Once | RESPIRATORY_TRACT | Status: AC
  Administered 2018-12-28: 13:00:00 4 via RESPIRATORY_TRACT

## 2018-12-28 MED ORDER — PREDNISONE 50 MG PO TABS
60.0000 mg | ORAL_TABLET | Freq: Once | ORAL | Status: AC
  Administered 2018-12-28: 60 mg via ORAL
  Filled 2018-12-28: qty 1

## 2018-12-28 MED ORDER — ALBUTEROL SULFATE HFA 108 (90 BASE) MCG/ACT IN AERS
INHALATION_SPRAY | RESPIRATORY_TRACT | Status: AC
  Filled 2018-12-28: qty 6.7

## 2018-12-28 MED ORDER — IPRATROPIUM BROMIDE HFA 17 MCG/ACT IN AERS
INHALATION_SPRAY | RESPIRATORY_TRACT | Status: AC
  Filled 2018-12-28: qty 12.9

## 2018-12-28 MED ORDER — IPRATROPIUM BROMIDE HFA 17 MCG/ACT IN AERS
4.0000 | INHALATION_SPRAY | Freq: Once | RESPIRATORY_TRACT | Status: AC
  Administered 2018-12-28: 15:00:00 4 via RESPIRATORY_TRACT

## 2018-12-28 MED FILL — predniSONE 20 MG TABS: 20 | 5 days supply | Qty: 10 | Fill #0

## 2018-12-28 NOTE — ED Notes (Signed)
Patient denies pain and is resting comfortably.  

## 2018-12-28 NOTE — ED Triage Notes (Signed)
Hx of asthma. He ran out of his inhaler a week ago. Sob today.

## 2018-12-28 NOTE — Discharge Instructions (Signed)
Use the albuterol inhaler every 4-6 hours as needed for shortness of breath and wheezing.  You can add in using the Atrovent 1 or 2 times at the most per day if the shortness of breath is not improved with the albuterol.  Take the full course of the prednisone.  Please return if your symptoms worsen or if you start developing fever and productive cough.

## 2018-12-28 NOTE — ED Provider Notes (Signed)
[facility] EMERGENCY DEPARTMENT Provider Note   CSN: [id number] Arrival date & time: 12/28/18  1248     History   Chief Complaint No chief complaint on file.   HPI Jeff Savage is a 28 y.o. male.     The history is provided by the patient.  Shortness of Breath Severity:  Moderate Onset quality:  Gradual Duration:  10 days Timing:  Intermittent Progression:  Worsening Chronicity:  Recurrent Context: weather changes   Context comment:  Hx of asthma Relieved by:  Nothing Worsened by:  Activity and weather changes Ineffective treatments:  Inhaler Associated symptoms: cough, sputum production and wheezing   Associated symptoms: no abdominal pain, no chest pain, no fever, no headaches, no rash and no vomiting   Risk factors: tobacco use   Risk factors comment:  Asthma.  COVID neg last week when tested before his child was born   Past Medical History:  Diagnosis Date  . Asthma     There are no active problems to display for this patient.   History reviewed. No pertinent surgical history.      Home Medications    Prior to Admission medications   Medication Sig Start Date End Date Taking? Authorizing Provider  HYDROcodone-acetaminophen (NORCO) 5-325 MG tablet Take 1 tablet by mouth every 4 (four) hours as needed for severe pain. 10/10/17   Molpus, John, MD  naproxen (NAPROSYN) 375 MG tablet Take 1 tablet twice daily as needed for pain. 10/10/17   Molpus, John, MD    Family History No family history on file.  Social History Social History   Tobacco Use  . Smoking status: Current Every Day Smoker    Packs/day: 0.50    Types: Cigarettes  . Smokeless tobacco: Never Used  Substance Use Topics  . Alcohol use: Yes    Frequency: Never  . Drug use: Never     Allergies   Patient has no known allergies.   Review of Systems Review of Systems  Constitutional: Negative for fever.  Respiratory: Positive for cough, sputum production, shortness of  breath and wheezing.   Cardiovascular: Negative for chest pain.  Gastrointestinal: Negative for abdominal pain and vomiting.  Skin: Negative for rash.  Neurological: Negative for headaches.  All other systems reviewed and are negative.    Physical Exam Updated Vital Signs BP (!) 136/95   Pulse 92   Temp 98.8 F (37.1 C) (Oral)   Resp 16   Ht 5\' 9"  (1.753 m)   Wt 93.4 kg   SpO2 91%   BMI 30.42 kg/m   Physical Exam Constitutional:      General: He is not in acute distress.    Appearance: He is well-developed and normal weight.  HENT:     Head: Normocephalic and atraumatic.     Right Ear: External ear normal.     Left Ear: External ear normal.  Eyes:     General:        Right eye: No discharge.     Conjunctiva/sclera: Conjunctivae normal.     Pupils: Pupils are equal, round, and reactive to light.  Neck:     Musculoskeletal: Normal range of motion and neck supple.  Cardiovascular:     Rate and Rhythm: Normal rate and regular rhythm.     Heart sounds: Normal heart sounds. No murmur.  Pulmonary:     Effort: Pulmonary effort is normal. Tachypnea present. No respiratory distress.     Breath sounds: Decreased air movement present. Wheezing present.  No decreased breath sounds, rhonchi or rales.  Abdominal:     Palpations: Abdomen is soft.     Tenderness: There is no abdominal tenderness.  Musculoskeletal: Normal range of motion.        General: No tenderness.     Right lower leg: No edema.     Left lower leg: No edema.  Skin:    General: Skin is warm and dry.     Findings: No rash.  Neurological:     General: No focal deficit present.     Mental Status: He is alert and oriented to person, place, and time. Mental status is at baseline.  Psychiatric:        Mood and Affect: Mood normal.        Behavior: Behavior normal.        Thought Content: Thought content normal.      ED Treatments / Results  Labs (all labs ordered are listed, but only abnormal results are  displayed) Labs Reviewed - No data to display  EKG None  Radiology No results found.  Procedures Procedures (including critical care time)  Medications Ordered in ED Medications  albuterol (VENTOLIN HFA) 108 (90 Base) MCG/ACT inhaler 8 puff (has no administration in time range)  ipratropium (ATROVENT HFA) inhaler 4 puff (has no administration in time range)  predniSONE (DELTASONE) tablet 60 mg (has no administration in time range)     Initial Impression / Assessment and Plan / ED Course  I have reviewed the triage vital signs and the nursing notes.  Pertinent labs & imaging results that were available during my care of the patient were reviewed by me and considered in my medical decision making (see chart for details).        Pt with typical asthma exacerbation symptoms.  No infectious sx, fever or other complaints.  Wheezing on exam.  will give steroids, albuterol/atrovent and recheck.  COVID neg last week when had manditory testing.  2:09 PM On reevaluation patient states he is feeling much better.  He is now gotten better air movement but more pronounced expiratory wheezing.  Will give a second treatment.  2:44 PM Wheezing resolved and pt improved.  Will d/c home.  Final Clinical Impressions(s) / ED Diagnoses   Final diagnoses:  Exacerbation of persistent asthma, unspecified asthma severity    ED Discharge Orders         Ordered    predniSONE (DELTASONE) 20 MG tablet  Daily     12/28/18 1441           Gwyneth Sprout, MD 12/28/18 1444

## 2019-05-11 ENCOUNTER — Other Ambulatory Visit: Payer: Self-pay

## 2019-05-11 ENCOUNTER — Emergency Department (HOSPITAL_BASED_OUTPATIENT_CLINIC_OR_DEPARTMENT_OTHER): Payer: Self-pay

## 2019-05-11 ENCOUNTER — Encounter (HOSPITAL_BASED_OUTPATIENT_CLINIC_OR_DEPARTMENT_OTHER): Payer: Self-pay

## 2019-05-11 ENCOUNTER — Emergency Department (HOSPITAL_BASED_OUTPATIENT_CLINIC_OR_DEPARTMENT_OTHER)
Admission: EM | Admit: 2019-05-11 | Discharge: 2019-05-11 | Disposition: A | Discharge status: Home / Self Care | Payer: Self-pay | Attending: Emergency Medicine | Admitting: Emergency Medicine

## 2019-05-11 DIAGNOSIS — F17210 Nicotine dependence, cigarettes, uncomplicated: Secondary | ICD-10-CM | POA: Insufficient documentation

## 2019-05-11 DIAGNOSIS — R0981 Nasal congestion: Secondary | ICD-10-CM | POA: Insufficient documentation

## 2019-05-11 DIAGNOSIS — R0602 Shortness of breath: Secondary | ICD-10-CM

## 2019-05-11 DIAGNOSIS — Z20822 Contact with and (suspected) exposure to covid-19: Secondary | ICD-10-CM | POA: Insufficient documentation

## 2019-05-11 DIAGNOSIS — J45901 Unspecified asthma with (acute) exacerbation: Secondary | ICD-10-CM | POA: Insufficient documentation

## 2019-05-11 DIAGNOSIS — R509 Fever, unspecified: Secondary | ICD-10-CM | POA: Insufficient documentation

## 2019-05-11 LAB — SARS CORONAVIRUS 2 AG (30 MIN TAT): SARS Coronavirus 2 Ag: NEGATIVE

## 2019-05-11 MED ORDER — IPRATROPIUM BROMIDE HFA 17 MCG/ACT IN AERS
INHALATION_SPRAY | RESPIRATORY_TRACT | Status: AC
  Administered 2019-05-11: 2 via RESPIRATORY_TRACT
  Filled 2019-05-11: qty 12.9

## 2019-05-11 MED ORDER — ALBUTEROL SULFATE HFA 108 (90 BASE) MCG/ACT IN AERS: 8.0000 | INHALATION_SPRAY | Freq: Once | RESPIRATORY_TRACT | Status: AC

## 2019-05-11 MED ORDER — IPRATROPIUM BROMIDE HFA 17 MCG/ACT IN AERS: 2.0000 | INHALATION_SPRAY | Freq: Once | RESPIRATORY_TRACT | Status: AC

## 2019-05-11 MED ORDER — ALBUTEROL SULFATE HFA 108 (90 BASE) MCG/ACT IN AERS
INHALATION_SPRAY | RESPIRATORY_TRACT | Status: AC
  Administered 2019-05-11: 8 via RESPIRATORY_TRACT
  Filled 2019-05-11: qty 6.7

## 2019-05-11 MED ORDER — ACETAMINOPHEN 325 MG PO TABS
650.0000 mg | ORAL_TABLET | Freq: Once | ORAL | Status: AC
  Administered 2019-05-11: 19:00:00 650 mg via ORAL
  Filled 2019-05-11: qty 2

## 2019-05-11 NOTE — ED Triage Notes (Signed)
Pt c/o SOB, wheezing x 5 days-fever x today-RT was in to tx pt prior to triage

## 2019-05-11 NOTE — Discharge Instructions (Addendum)
Take the Zyrtec as directed.  Take Tylenol as needed for fever.  Formal Covid testing has been ordered and is pending.  Should result in a day or 2.  Use the albuterol inhaler 2 puffs every 6 hours.  Return for any new or worse symptoms.

## 2019-05-11 NOTE — ED Provider Notes (Signed)
[facility] EMERGENCY DEPARTMENT Provider Note   CSN: [id number] Arrival date & time: 05/11/19  1839     History Chief Complaint  Patient presents with  . Shortness of Breath    Jeff Savage is a 29 y.o. male.  Patient presenting with a complaint of shortness of breath and wheezing for 5 days.  Today started with a fever.  No body aches no nausea vomiting or diarrhea.  No sore throat no abdominal pain no dysuria.  Past medical history sniffing for asthma.  Patient's been out of albuterol inhaler.  No family members are sick.        Past Medical History:  Diagnosis Date  . Asthma     There are no problems to display for this patient.   History reviewed. No pertinent surgical history.     No family history on file.  Social History   Tobacco Use  . Smoking status: Current Every Day Smoker    Packs/day: 0.50    Types: Cigarettes  . Smokeless tobacco: Never Used  Substance Use Topics  . Alcohol use: Yes    Comment: occ  . Drug use: Never    Home Medications Prior to Admission medications   Medication Sig Start Date End Date Taking? Authorizing Provider  HYDROcodone-acetaminophen (NORCO) 5-325 MG tablet Take 1 tablet by mouth every 4 (four) hours as needed for severe pain. 10/10/17   Molpus, John, MD  naproxen (NAPROSYN) 375 MG tablet Take 1 tablet twice daily as needed for pain. 10/10/17   Molpus, John, MD  predniSONE (DELTASONE) 20 MG tablet Take 2 tablets (40 mg total) by mouth daily. 12/28/18   Gwyneth Sprout, MD    Allergies    Shellfish allergy  Review of Systems   Review of Systems  Constitutional: Positive for fever. Negative for chills.  HENT: Positive for congestion. Negative for rhinorrhea and sore throat.   Eyes: Negative for visual disturbance.  Respiratory: Positive for shortness of breath and wheezing. Negative for cough.   Cardiovascular: Negative for chest pain and leg swelling.  Gastrointestinal: Negative for abdominal  pain, diarrhea, nausea and vomiting.  Genitourinary: Negative for dysuria.  Musculoskeletal: Negative for back pain and neck pain.  Skin: Negative for rash.  Neurological: Negative for dizziness, light-headedness and headaches.  Hematological: Does not bruise/bleed easily.  Psychiatric/Behavioral: Negative for confusion.    Physical Exam Updated Vital Signs BP 131/78 (BP Location: Right Arm)   Pulse (!) 132   Temp (!) 101.1 F (38.4 C) (Oral)   Resp (!) 22   SpO2 93%   Physical Exam  ED Results / Procedures / Treatments   Labs (all labs ordered are listed, but only abnormal results are displayed) Labs Reviewed  SARS CORONAVIRUS 2 AG (30 MIN TAT)  SARS CORONAVIRUS 2 (TAT 6-24 HRS)    EKG None  Radiology DG Chest Port 1 View  Result Date: 05/11/2019 CLINICAL DATA:  Shortness of breath. Wheezing. Asthma exacerbation. EXAM: PORTABLE CHEST 1 VIEW COMPARISON:  01/25/2019 FINDINGS: The cardiomediastinal contours are normal. Mild hyperinflation. Increased bronchial thickening from prior. Pulmonary vasculature is normal. No consolidation, pleural effusion, or pneumothorax. No acute osseous abnormalities are seen. IMPRESSION: Hyperinflation with increased bronchial thickening consistent with asthma. Electronically Signed   By: Narda Rutherford M.D.   On: 05/11/2019 20:18    Procedures Procedures (including critical care time)  Medications Ordered in ED Medications  albuterol (VENTOLIN HFA) 108 (90 Base) MCG/ACT inhaler 8 puff (8 puffs Inhalation Given 05/11/19 1901)  ipratropium (  ATROVENT HFA) inhaler 2 puff (2 puffs Inhalation Given 05/11/19 1901)  acetaminophen (TYLENOL) tablet 650 mg (650 mg Oral Given 05/11/19 1912)    ED Course  I have reviewed the triage vital signs and the nursing notes.  Pertinent labs & imaging results that were available during my care of the patient were reviewed by me and considered in my medical decision making (see chart for details).    MDM  Rules/Calculators/A&P                         Patient's rapid Covid testing here is negative.  Have done formal testing.  Patient not very Covid-like symptoms other than the fever.  Patient's chest x-ray is negative for any pneumonia.  But consistent with asthma.  Patient treated here with albuterol feels much better.  Will continue albuterol.  Patient picked up Zyrtec.  Is possibly an allergy component.  But that would not explain the fever.  Patient will self isolate until formal Covid test is back.  Patient will return for any new or worse symptoms. Final Clinical Impression(s) / ED Diagnoses Final diagnoses:  Moderate asthma with exacerbation, unspecified whether persistent  Nasal congestion  Fever, unspecified fever cause    Rx / DC Orders ED Discharge Orders    None       Fredia Sorrow, MD 05/11/19 2110

## 2019-05-12 LAB — SARS CORONAVIRUS 2 (TAT 6-24 HRS): SARS Coronavirus 2: NEGATIVE

## 2019-08-29 ENCOUNTER — Emergency Department (HOSPITAL_BASED_OUTPATIENT_CLINIC_OR_DEPARTMENT_OTHER)
Admission: EM | Admit: 2019-08-29 | Discharge: 2019-08-29 | Disposition: A | Discharge status: Home / Self Care | Payer: Self-pay | Attending: Emergency Medicine | Admitting: Emergency Medicine

## 2019-08-29 ENCOUNTER — Encounter (HOSPITAL_BASED_OUTPATIENT_CLINIC_OR_DEPARTMENT_OTHER): Payer: Self-pay

## 2019-08-29 ENCOUNTER — Other Ambulatory Visit: Payer: Self-pay

## 2019-08-29 DIAGNOSIS — Z79899 Other long term (current) drug therapy: Secondary | ICD-10-CM | POA: Insufficient documentation

## 2019-08-29 DIAGNOSIS — J45901 Unspecified asthma with (acute) exacerbation: Secondary | ICD-10-CM | POA: Insufficient documentation

## 2019-08-29 DIAGNOSIS — F17210 Nicotine dependence, cigarettes, uncomplicated: Secondary | ICD-10-CM | POA: Insufficient documentation

## 2019-08-29 MED ORDER — ALBUTEROL SULFATE (5 MG/ML) 0.5% IN NEBU: 2.5000 mg | INHALATION_SOLUTION | Freq: Four times a day (QID) | RESPIRATORY_TRACT | 0 refills | Status: DC | PRN

## 2019-08-29 MED ORDER — ALBUTEROL SULFATE HFA 108 (90 BASE) MCG/ACT IN AERS
INHALATION_SPRAY | RESPIRATORY_TRACT | Status: AC
  Administered 2019-08-29: 4
  Filled 2019-08-29: qty 6.7

## 2019-08-29 MED ORDER — ALBUTEROL SULFATE HFA 108 (90 BASE) MCG/ACT IN AERS: 2.0000 | INHALATION_SPRAY | Freq: Four times a day (QID) | RESPIRATORY_TRACT | 0 refills | Status: DC | PRN

## 2019-08-29 MED ORDER — ALBUTEROL SULFATE (2.5 MG/3ML) 0.083% IN NEBU: 2.5000 mg | INHALATION_SOLUTION | Freq: Four times a day (QID) | RESPIRATORY_TRACT | 0 refills | Status: DC | PRN

## 2019-08-29 MED FILL — PROAIR HFA 90 MCG INHALER: 108 (90 BAS | 25 days supply | Qty: 9 | Fill #0

## 2019-08-29 MED FILL — ALBUTEROL SUL 2.5 MG/3 ML S: (2.5 MG/3ML | 7 days supply | Qty: 75 | Fill #0

## 2019-08-29 NOTE — ED Triage Notes (Addendum)
Pt c/o "asthma" x 2 days and ran out of inhaler while out of town-denies cough/fever/flu sx-RT assessed pt prior to triage and gave albuterol inhaler-pt NAD-steady gait

## 2019-08-29 NOTE — Progress Notes (Signed)
Rt saw patient in triage. Stated he was traveling and ran out of MDI. BBS wheezes, no distress noted. Spoke with MD and decided to give MDI in triage due to no rooms available. Patient stated he feels better already.

## 2019-08-29 NOTE — ED Provider Notes (Signed)
[facility] EMERGENCY DEPARTMENT Provider Note   CSN: [id number] Arrival date & time: 08/29/19  1158     History Chief Complaint  Patient presents with  . Asthma    Jeff Savage is a 29 y.o. male presenting for evaluation of shortness of breath  Patient states he ran out of his albuterol inhaler 2 days ago.  Since then, he has had shortness of breath and wheezing.  He denies fevers, chills, cough, chest pain.  No ear pain, nasal congestion, sore throat.  He has no other medical problems besides asthma, takes no other medicines daily.  He stopped smoking cigarettes 2 weeks ago.  He has not received his Covid vaccines.  He denies sick contacts.  He denies contact with known COVID-19 positive person.  HPI     Past Medical History:  Diagnosis Date  . Asthma     There are no problems to display for this patient.   History reviewed. No pertinent surgical history.     No family history on file.  Social History   Tobacco Use  . Smoking status: Current Every Day Smoker    Packs/day: 0.50    Types: Cigarettes  . Smokeless tobacco: Never Used  . Tobacco comment: stopped 2 weeks ago as of 08/29/2019  Vaping Use  . Vaping Use: Never used  Substance Use Topics  . Alcohol use: Yes    Comment: occ  . Drug use: Yes    Types: Marijuana    Home Medications Prior to Admission medications   Medication Sig Start Date End Date Taking? Authorizing Provider  albuterol (PROVENTIL) (5 MG/ML) 0.5% nebulizer solution Take 2.5 mg by nebulization every 6 (six) hours as needed for wheezing or shortness of breath.   Yes [provider]  albuterol (VENTOLIN HFA) 108 (90 Base) MCG/ACT inhaler Inhale 2 puffs into the lungs every 6 (six) hours as needed for wheezing or shortness of breath.   Yes [provider]    Allergies    Shellfish allergy  Review of Systems   Review of Systems  Constitutional: Negative for fever.  Respiratory: Positive for wheezing.       Physical Exam Updated Vital Signs BP (!) 133/92 (BP Location: Right Arm)   Pulse 88   Temp 98.1 F (36.7 C) (Oral)   Resp 19   Ht 5\' 9"  (1.753 m)   Wt 99.8 kg   SpO2 95%   BMI 32.49 kg/m   Physical Exam Vitals and nursing note reviewed.  Constitutional:      General: He is not in acute distress.    Appearance: He is well-developed.     Comments: Sitting in the bed comfortably in no acute distress  HENT:     Head: Normocephalic and atraumatic.  Cardiovascular:     Rate and Rhythm: Normal rate and regular rhythm.     Pulses: Normal pulses.  Pulmonary:     Effort: Pulmonary effort is normal.     Breath sounds: Normal breath sounds. No wheezing.     Comments: Speaking full sentences.  Clear lung sounds in all fields.  No wheezing, rales, rhonchi.  Sats stable on room air.  No accessory muscle use. Abdominal:     General: There is no distension.  Musculoskeletal:        General: Normal range of motion.     Cervical back: Normal range of motion.  Skin:    General: Skin is warm.     Findings: No rash.  Neurological:  Mental Status: He is alert and oriented to person, place, and time.     ED Results / Procedures / Treatments   Labs (all labs ordered are listed, but only abnormal results are displayed) Labs Reviewed - No data to display  EKG None  Radiology No results found.  Procedures Procedures (including critical care time)  Medications Ordered in ED Medications  albuterol (VENTOLIN HFA) 108 (90 Base) MCG/ACT inhaler (4 puffs  Given 08/29/19 1209)    ED Course  I have reviewed the triage vital signs and the nursing notes.  Pertinent labs & imaging results that were available during my care of the patient were reviewed by me and considered in my medical decision making (see chart for details).    MDM Rules/Calculators/A&P                          Patient presenting for evaluation of shortness of breath and wheezing since running out of his  albuterol inhaler.  On exam, patient appears nontoxic.  He was given albuterol in triage, states this completely relieved his symptoms.  He states this feels consistent with his normal asthma exacerbation.  He does not report any infectious symptoms including fever, cough, chest pain, or HEENT complaints.  As such, likely asthma flare due to lack of albuterol.  Discussed importance of continued smoking cessation.  Encourage patient get Covid vaccines.  At this time, patient appears safe for discharge.  Return precautions given.  Patient states he understands and agrees to plan.  Final Clinical Impression(s) / ED Diagnoses Final diagnoses:  Exacerbation of asthma, unspecified asthma severity, unspecified whether persistent    Rx / DC Orders ED Discharge Orders    None       Alveria Apley, PA-C 08/29/19 1414    Tegeler, Canary Brim, MD 08/29/19 1553

## 2019-08-29 NOTE — Discharge Instructions (Signed)
Use your inhaler as needed for shortness of breath or wheezing. Return to the ER if you develop fever, CP, cough. Return with any new, worsening, or concerning symptoms.

## 2020-04-12 ENCOUNTER — Emergency Department (HOSPITAL_BASED_OUTPATIENT_CLINIC_OR_DEPARTMENT_OTHER)
Admission: EM | Admit: 2020-04-12 | Discharge: 2020-04-12 | Disposition: A | Discharge status: Home / Self Care | Payer: Medicaid Other | Attending: Emergency Medicine | Admitting: Emergency Medicine

## 2020-04-12 ENCOUNTER — Encounter (HOSPITAL_BASED_OUTPATIENT_CLINIC_OR_DEPARTMENT_OTHER): Payer: Self-pay

## 2020-04-12 ENCOUNTER — Other Ambulatory Visit: Payer: Self-pay

## 2020-04-12 ENCOUNTER — Emergency Department (HOSPITAL_BASED_OUTPATIENT_CLINIC_OR_DEPARTMENT_OTHER): Payer: Medicaid Other

## 2020-04-12 DIAGNOSIS — R059 Cough, unspecified: Secondary | ICD-10-CM | POA: Diagnosis present

## 2020-04-12 DIAGNOSIS — F17210 Nicotine dependence, cigarettes, uncomplicated: Secondary | ICD-10-CM | POA: Diagnosis not present

## 2020-04-12 DIAGNOSIS — J4541 Moderate persistent asthma with (acute) exacerbation: Secondary | ICD-10-CM | POA: Insufficient documentation

## 2020-04-12 DIAGNOSIS — Z20822 Contact with and (suspected) exposure to covid-19: Secondary | ICD-10-CM | POA: Diagnosis not present

## 2020-04-12 LAB — CBC WITH DIFFERENTIAL/PLATELET
Abs Immature Granulocytes: 0.03 10*3/uL (ref 0.00–0.07)
Basophils Absolute: 0 10*3/uL (ref 0.0–0.1)
Basophils Relative: 0 %
Eosinophils Absolute: 0.5 10*3/uL (ref 0.0–0.5)
Eosinophils Relative: 5 %
HCT: 45.6 % (ref 39.0–52.0)
Hemoglobin: 15.8 g/dL (ref 13.0–17.0)
Immature Granulocytes: 0 %
Lymphocytes Relative: 17 %
Lymphs Abs: 1.8 10*3/uL (ref 0.7–4.0)
MCH: 29.8 pg (ref 26.0–34.0)
MCHC: 34.6 g/dL (ref 30.0–36.0)
MCV: 85.9 fL (ref 80.0–100.0)
Monocytes Absolute: 1.2 10*3/uL — ABNORMAL HIGH (ref 0.1–1.0)
Monocytes Relative: 11 %
Neutro Abs: 7.1 10*3/uL (ref 1.7–7.7)
Neutrophils Relative %: 67 %
Platelets: 223 10*3/uL (ref 150–400)
RBC: 5.31 MIL/uL (ref 4.22–5.81)
RDW: 13.4 % (ref 11.5–15.5)
WBC: 10.7 10*3/uL — ABNORMAL HIGH (ref 4.0–10.5)
nRBC: 0 % (ref 0.0–0.2)

## 2020-04-12 LAB — BASIC METABOLIC PANEL
Anion gap: 10 (ref 5–15)
BUN: 12 mg/dL (ref 6–20)
CO2: 23 mmol/L (ref 22–32)
Calcium: 9.3 mg/dL (ref 8.9–10.3)
Chloride: 105 mmol/L (ref 98–111)
Creatinine, Ser: 1.01 mg/dL (ref 0.61–1.24)
GFR, Estimated: >60 mL/min (ref >60)
Glucose, Bld: 107 mg/dL — ABNORMAL HIGH (ref 70–99)
Potassium: 3.6 mmol/L (ref 3.5–5.1)
Sodium: 138 mmol/L (ref 135–145)

## 2020-04-12 LAB — SARS CORONAVIRUS 2 BY RT PCR (HOSPITAL ORDER, PERFORMED IN ~~LOC~~ HOSPITAL LAB): SARS Coronavirus 2: NEGATIVE

## 2020-04-12 MED ORDER — ALBUTEROL SULFATE HFA 108 (90 BASE) MCG/ACT IN AERS
2.0000 | INHALATION_SPRAY | Freq: Once | RESPIRATORY_TRACT | Status: DC
  Filled 2020-04-12: qty 6.7

## 2020-04-12 MED ORDER — MAGNESIUM SULFATE 2 GM/50ML IV SOLN
2.0000 g | Freq: Once | INTRAVENOUS | Status: AC
  Administered 2020-04-12: 2 g via INTRAVENOUS
  Filled 2020-04-12: qty 50

## 2020-04-12 MED ORDER — FLUTICASONE-SALMETEROL 100-50 MCG/DOSE IN AEPB: 1.0000 | INHALATION_SPRAY | Freq: Two times a day (BID) | RESPIRATORY_TRACT | 1 refills | Status: DC

## 2020-04-12 MED ORDER — PREDNISONE 20 MG PO TABS: ORAL_TABLET | ORAL | 0 refills | Status: DC

## 2020-04-12 MED ORDER — FLUTICASONE-SALMETEROL 100-50 MCG/DOSE IN AEPB: 1.0000 | INHALATION_SPRAY | Freq: Every day | RESPIRATORY_TRACT | 0 refills | Status: DC

## 2020-04-12 MED ORDER — IPRATROPIUM BROMIDE 0.02 % IN SOLN
1.0000 mg | Freq: Once | RESPIRATORY_TRACT | Status: AC
  Administered 2020-04-12: 1 mg via RESPIRATORY_TRACT
  Filled 2020-04-12: qty 5

## 2020-04-12 MED ORDER — METHYLPREDNISOLONE SODIUM SUCC 125 MG IJ SOLR
125.0000 mg | Freq: Once | INTRAMUSCULAR | Status: AC
  Administered 2020-04-12: 125 mg via INTRAVENOUS
  Filled 2020-04-12: qty 2

## 2020-04-12 MED ORDER — ALBUTEROL (5 MG/ML) CONTINUOUS INHALATION SOLN
10.0000 mg/h | INHALATION_SOLUTION | Freq: Once | RESPIRATORY_TRACT | Status: AC
  Administered 2020-04-12: 10 mg/h via RESPIRATORY_TRACT
  Filled 2020-04-12: qty 20

## 2020-04-12 NOTE — Progress Notes (Signed)
PT states he is breathing better at this time- CAT is still running. Vitals documented on Flowsheet.

## 2020-04-12 NOTE — ED Provider Notes (Signed)
[facility] EMERGENCY DEPARTMENT Provider Note   CSN: [id number] Arrival date & time: 04/12/20  5366     History Chief Complaint  Patient presents with  . Shortness of Breath    Jeff Savage is a 30 y.o. male.  HPI Patient reports he has history of asthma. He reports he ran out of his inhalers about 2 days ago. For the past week he has had increased cough with productive white sputum. He endorses a generalized chest tightness but no localizing pain. He has become increasingly short of breath. He reports he was using his inhalers with increased frequency but then ran out. He denies any fevers or chills. No body aches. No nasal congestion or sore throat. No lower extremity swelling or calf pain. Patient does actively smoke cigarettes.    Past Medical History:  Diagnosis Date  . Asthma     There are no problems to display for this patient.   History reviewed. No pertinent surgical history.     History reviewed. No pertinent family history.  Social History   Tobacco Use  . Smoking status: Current Every Day Smoker    Packs/day: 0.50    Types: Cigarettes  . Smokeless tobacco: Never Used  . Tobacco comment: stopped 2 weeks ago as of 08/29/2019  Vaping Use  . Vaping Use: Never used  Substance Use Topics  . Alcohol use: Yes    Comment: occ  . Drug use: Yes    Types: Marijuana    Home Medications Prior to Admission medications   Medication Sig Start Date End Date Taking? Authorizing Provider  predniSONE (DELTASONE) 20 MG tablet 3 tabs po day one, then 2 po daily x 4 days 04/12/20  Yes Penelopi Mikrut, Lebron Conners, MD  albuterol (PROVENTIL) (2.5 MG/3ML) 0.083% nebulizer solution Take 3 mLs (2.5 mg total) by nebulization every 6 (six) hours as needed for wheezing or shortness of breath. 08/29/19   Caccavale, Sophia, PA-C  albuterol (PROVENTIL) (5 MG/ML) 0.5% nebulizer solution Take 0.5 mLs (2.5 mg total) by nebulization every 6 (six) hours as needed for wheezing or shortness  of breath. 08/29/19   Caccavale, Sophia, PA-C  albuterol (VENTOLIN HFA) 108 (90 Base) MCG/ACT inhaler Inhale 2 puffs into the lungs every 6 (six) hours as needed for wheezing or shortness of breath. 08/29/19   Caccavale, Sophia, PA-C  Fluticasone-Salmeterol (ADVAIR DISKUS) 100-50 MCG/DOSE AEPB Inhale 1 puff into the lungs 2 (two) times daily. 04/12/20   Arby Barrette, MD    Allergies    Shellfish allergy  Review of Systems   Review of Systems 10 systems reviewed and negative except as per HPI Physical Exam Updated Vital Signs BP (!) 157/105 (BP Location: Right Arm)   Pulse (!) 117   Temp 97.7 F (36.5 C) (Oral)   Resp 18   SpO2 94%   Physical Exam Constitutional:      Comments: Patient is alert and nontoxic. Moderate increased work of breathing. Speaking in full sentences without difficulty.  HENT:     Head: Normocephalic and atraumatic.     Mouth/Throat:     Pharynx: Oropharynx is clear.  Eyes:     Extraocular Movements: Extraocular movements intact.  Cardiovascular:     Comments: Tachycardia no rub murmur gallop Pulmonary:     Comments: Moderate increased work of breathing. Diffuse wheezing throughout lung fields. Diminished airflow at bases. Abdominal:     General: There is no distension.     Palpations: Abdomen is soft.     Tenderness: There  is no abdominal tenderness. There is no guarding.  Musculoskeletal:        General: No swelling or tenderness. Normal range of motion.     Right lower leg: No edema.     Left lower leg: No edema.  Skin:    General: Skin is warm and dry.  Neurological:     General: No focal deficit present.     Mental Status: He is oriented to person, place, and time.     Coordination: Coordination normal.  Psychiatric:        Mood and Affect: Mood normal.     ED Results / Procedures / Treatments   Labs (all labs ordered are listed, but only abnormal results are displayed) Labs Reviewed  BASIC METABOLIC PANEL - Abnormal; Notable for the  following components:      Result Value   Glucose, Bld 107 (*)    All other components within normal limits  CBC WITH DIFFERENTIAL/PLATELET - Abnormal; Notable for the following components:   WBC 10.7 (*)    Monocytes Absolute 1.2 (*)    All other components within normal limits  SARS CORONAVIRUS 2 (HOSPITAL ORDER, PERFORMED IN [facility] LAB)    EKG None  Radiology DG Chest Port 1 View  Result Date: 04/12/2020 CLINICAL DATA:  Shortness of breath 3 days. EXAM: PORTABLE CHEST 1 VIEW COMPARISON:  11/09/2019 FINDINGS: The heart size and mediastinal contours are within normal limits. Both lungs are clear. The visualized skeletal structures are unremarkable. IMPRESSION: No active disease. Electronically Signed   By: Elberta Fortis M.D.   On: 04/12/2020 09:27    Procedures Procedures  CRITICAL CARE Performed by: Arby Barrette   Total critical care time: 30 minutes  Critical care time was exclusive of separately billable procedures and treating other patients.  Critical care was necessary to treat or prevent imminent or life-threatening deterioration.  Critical care was time spent personally by me on the following activities: development of treatment plan with patient and/or surrogate as well as nursing, discussions with consultants, evaluation of patient's response to treatment, examination of patient, obtaining history from patient or surrogate, ordering and performing treatments and interventions, ordering and review of laboratory studies, ordering and review of radiographic studies, pulse oximetry and re-evaluation of patient's condition. Medications Ordered in ED Medications  albuterol (VENTOLIN HFA) 108 (90 Base) MCG/ACT inhaler 2 puff (2 puffs Inhalation Not Given 04/12/20 1337)  methylPREDNISolone sodium succinate (SOLU-MEDROL) 125 mg/2 mL injection 125 mg (125 mg Intravenous Given 04/12/20 0905)  albuterol (PROVENTIL,VENTOLIN) solution continuous neb (10 mg/hr  Nebulization Given 04/12/20 0852)  ipratropium (ATROVENT) nebulizer solution 1 mg (1 mg Nebulization Given 04/12/20 0852)  magnesium sulfate IVPB 2 g 50 mL (0 g Intravenous Stopped 04/12/20 1252)    ED Course  I have reviewed the triage vital signs and the nursing notes.  Pertinent labs & imaging results that were available during my care of the patient were reviewed by me and considered in my medical decision making (see chart for details).  Clinical Course as of 04/12/20 1340  Sat Apr 12, 2020  1022 Recheck: Patient is doing his continuous nebulizer treatment.  He feels that he is improving.  Alert with mild increased work of breathing.  Auscultation continues to confirm diffuse wheezing throughout the lung fields. [MP]  1326 Patient reports he feels much better.  He is saturating 100% on room air.  No respiratory distress at rest.  Patient continues to have diffuse expiratory wheeze.  He reports  that this time he would usually be able to be discharged and take a prednisone burst at home with continued as needed albuterol use.  Patient does not wish to be admitted to the hospital. [MP]    Clinical Course User Index [MP] Arby Barrette, MD   MDM Rules/Calculators/A&P                          Patient presents with acute on chronic asthma exacerbation.  Patient is visiting the area.  He did not bring his daily asthma inhaler.  He reports he takes a powder inhaler but cannot remember which one.  He has been off of this for several days.  He reports he ran out of his albuterol MDI.  He has been smoking.  He reports that over several days he was getting increasingly short of breath but got a lot worse this morning.  On arrival, patient had moderate increased work of breathing.  He is speaking in full sentences.  He has wheezing throughout the lung fields.  Patient was started on a continuous albuterol and ipratropium treatment for an hour.  He reported significant subjective improvement after  completing as he still had diffuse wheezing although clinically better in appearance.  Patient was also given a 2 g dose of magnesium as well as Solu-Medrol 125 IV.  At completion of treatment, patient reports he feels much improved although he continues to have wheezing to auscultation.  Oxygen saturations are 100% and he is not exhibiting respiratory distress.  Patient advises he does not wish to stay in the hospital.  He reports at this point he typically improves if starting an oral prednisone burst and continuing albuterol at home.  Patient counseled under no circumstances can he have any exposure to secondhand smoke or smoke himself.  He voices understanding.  Patient is counseled he must return immediately should he perceive his breathing is getting more difficult. Final Clinical Impression(s) / ED Diagnoses Final diagnoses:  Moderate persistent asthma with exacerbation  Cigarette smoker    Rx / DC Orders ED Discharge Orders         Ordered    predniSONE (DELTASONE) 20 MG tablet        04/12/20 1329    Fluticasone-Salmeterol (ADVAIR DISKUS) 100-50 MCG/DOSE AEPB  Daily,   Status:  Discontinued        04/12/20 1329    Fluticasone-Salmeterol (ADVAIR DISKUS) 100-50 MCG/DOSE AEPB  2 times daily        04/12/20 1338           Arby Barrette, MD 04/12/20 1343

## 2020-04-12 NOTE — Discharge Instructions (Addendum)
1.  Start taking prednisone as prescribed after you fill your prescription this evening.  Use albuterol inhaler 2 puffs every 4 hours for the next 48 hours then as needed. 2.  Start Advair as prescribed.  This is not a rescue medication.  This is a daily maintenance medication for asthma.  As soon as you return home, call your doctor and review and confirm your usual asthma maintenance plan. 3.  You must avoid all smoke.  Under no circumstances any smoking of any type of cigarette, marijuana, cigar, pipe etc.  No secondhand smoke exposure. 4.  Return to the emergency department immediately if you feel that your breathing is becoming difficult or labored.

## 2020-04-12 NOTE — ED Notes (Signed)
ED Provider at bedside. 

## 2020-04-12 NOTE — ED Notes (Signed)
Pt complains of shortness of breath and chest tightness with history of asthma

## 2020-04-12 NOTE — ED Triage Notes (Signed)
Pt states hx asthma, shortness of breath x 3 days using inhaler much more than usual, neb tx, has run out of medication.  Out of medication now.

## 2020-04-12 NOTE — Progress Notes (Signed)
Peak Flow (best of 3 attempts)= 200. PT is unaware of baseline Peak Flow. PT states he feels good about his breathing at this time- is asking for Prednisone and MDI to get him through the weekend. PT plans to see primary MD when he returns to home state.

## 2020-06-07 ENCOUNTER — Other Ambulatory Visit: Payer: Self-pay

## 2020-06-07 ENCOUNTER — Encounter (HOSPITAL_BASED_OUTPATIENT_CLINIC_OR_DEPARTMENT_OTHER): Payer: Self-pay | Admitting: Emergency Medicine

## 2020-06-07 ENCOUNTER — Emergency Department (HOSPITAL_BASED_OUTPATIENT_CLINIC_OR_DEPARTMENT_OTHER)
Admission: EM | Admit: 2020-06-07 | Discharge: 2020-06-07 | Disposition: A | Discharge status: Home / Self Care | Payer: Medicaid Other | Attending: Emergency Medicine | Admitting: Emergency Medicine

## 2020-06-07 DIAGNOSIS — J45901 Unspecified asthma with (acute) exacerbation: Secondary | ICD-10-CM | POA: Diagnosis not present

## 2020-06-07 DIAGNOSIS — F17210 Nicotine dependence, cigarettes, uncomplicated: Secondary | ICD-10-CM | POA: Insufficient documentation

## 2020-06-07 DIAGNOSIS — R0602 Shortness of breath: Secondary | ICD-10-CM | POA: Diagnosis present

## 2020-06-07 MED ORDER — PREDNISONE 50 MG PO TABS
60.0000 mg | ORAL_TABLET | Freq: Once | ORAL | Status: AC
  Administered 2020-06-07: 60 mg via ORAL
  Filled 2020-06-07: qty 1

## 2020-06-07 MED ORDER — ALBUTEROL SULFATE (2.5 MG/3ML) 0.083% IN NEBU: 2.5000 mg | INHALATION_SOLUTION | Freq: Four times a day (QID) | RESPIRATORY_TRACT | 0 refills | Status: DC | PRN

## 2020-06-07 MED ORDER — ALBUTEROL SULFATE HFA 108 (90 BASE) MCG/ACT IN AERS
4.0000 | INHALATION_SPRAY | Freq: Once | RESPIRATORY_TRACT | Status: AC
  Administered 2020-06-07: 4 via RESPIRATORY_TRACT
  Filled 2020-06-07: qty 6.7

## 2020-06-07 MED ORDER — ALBUTEROL SULFATE HFA 108 (90 BASE) MCG/ACT IN AERS: 2.0000 | INHALATION_SPRAY | Freq: Four times a day (QID) | RESPIRATORY_TRACT | 0 refills | Status: DC | PRN

## 2020-06-07 MED ORDER — PREDNISONE 20 MG PO TABS: ORAL_TABLET | ORAL | 0 refills | Status: DC

## 2020-06-07 NOTE — ED Provider Notes (Signed)
[facility] EMERGENCY DEPARTMENT Provider Note   CSN: [id number] Arrival date & time: 06/07/20  4709     History Chief Complaint  Patient presents with  . Shortness of Breath    Jeff Savage is a 30 y.o. male.  H/o asthma, [place], doesn't have inhaler or nebulizer. Last couple days has had worsening dyspnea with non productive cough. No fever. No other associated symptoms.    Shortness of Breath      Past Medical History:  Diagnosis Date  . Asthma     There are no problems to display for this patient.   History reviewed. No pertinent surgical history.     History reviewed. No pertinent family history.  Social History   Tobacco Use  . Smoking status: Current Every Day Smoker    Packs/day: 0.50    Types: Cigarettes  . Smokeless tobacco: Never Used  . Tobacco comment: stopped 2 weeks ago as of 08/29/2019  Vaping Use  . Vaping Use: Never used  Substance Use Topics  . Alcohol use: Yes    Comment: occ  . Drug use: Yes    Types: Marijuana    Home Medications Prior to Admission medications   Medication Sig Start Date End Date Taking? Authorizing Provider  predniSONE (DELTASONE) 20 MG tablet 2 tabs po daily x 4 days 06/07/20  Yes Tsugio Elison, Barbara Cower, MD  albuterol (PROVENTIL) (2.5 MG/3ML) 0.083% nebulizer solution Take 3 mLs (2.5 mg total) by nebulization every 6 (six) hours as needed for wheezing or shortness of breath. 08/29/19   Caccavale, Sophia, PA-C  albuterol (PROVENTIL) (5 MG/ML) 0.5% nebulizer solution Take 0.5 mLs (2.5 mg total) by nebulization every 6 (six) hours as needed for wheezing or shortness of breath. 08/29/19   Caccavale, Sophia, PA-C  albuterol (VENTOLIN HFA) 108 (90 Base) MCG/ACT inhaler Inhale 2 puffs into the lungs every 6 (six) hours as needed for wheezing or shortness of breath. 06/07/20   Terree Gaultney, Barbara Cower, MD  Fluticasone-Salmeterol (ADVAIR DISKUS) 100-50 MCG/DOSE AEPB Inhale 1 puff into the lungs 2 (two) times daily. 04/12/20    Arby Barrette, MD    Allergies    Shellfish allergy  Review of Systems   Review of Systems  Respiratory: Positive for shortness of breath.   All other systems reviewed and are negative.   Physical Exam Updated Vital Signs BP 140/86 (BP Location: Left Arm)   Pulse 87   Temp 97.9 F (36.6 C) (Oral)   Resp 20   Ht 5\' 9"  (1.753 m)   Wt 99.8 kg   SpO2 95%   BMI 32.49 kg/m   Physical Exam Vitals and nursing note reviewed.  Constitutional:      Appearance: He is well-developed.  HENT:     Head: Normocephalic and atraumatic.  Cardiovascular:     Rate and Rhythm: Normal rate.  Pulmonary:     Effort: Pulmonary effort is normal. No respiratory distress.     Breath sounds: No decreased breath sounds, wheezing or rhonchi.  Abdominal:     General: There is no distension.  Musculoskeletal:        General: Normal range of motion.     Cervical back: Normal range of motion.  Skin:    General: Skin is warm and dry.  Neurological:     General: No focal deficit present.     Mental Status: He is alert.     ED Results / Procedures / Treatments   Labs (all labs ordered are listed, but  only abnormal results are displayed) Labs Reviewed - No data to display  EKG None  Radiology No results found.  Procedures Procedures   Medications Ordered in ED Medications  albuterol (VENTOLIN HFA) 108 (90 Base) MCG/ACT inhaler 4 puff (has no administration in time range)  predniSONE (DELTASONE) tablet 60 mg (has no administration in time range)    ED Course  I have reviewed the triage vital signs and the nursing notes.  Pertinent labs & imaging results that were available during my care of the patient were reviewed by me and considered in my medical decision making (see chart for details).    MDM Rules/Calculators/A&P                         Asthma exacerbation. Provided inhaler/steroids. No hypoxia, tachypnea, access muscle usage or other indication of pending respiratory  collapse. D/c on same meds. pred burst.   Final Clinical Impression(s) / ED Diagnoses Final diagnoses:  Moderate asthma with exacerbation, unspecified whether persistent    Rx / DC Orders ED Discharge Orders         Ordered    predniSONE (DELTASONE) 20 MG tablet        06/07/20 0631    albuterol (VENTOLIN HFA) 108 (90 Base) MCG/ACT inhaler  Every 6 hours PRN        06/07/20 0632           Atom Solivan, Barbara Cower, MD 06/07/20 [phone]

## 2020-06-07 NOTE — ED Triage Notes (Signed)
Pt is c/o shortness of breath that started yesterday    Pt has hx of asthma  Pt states he ran out of his inhaler

## 2020-07-23 ENCOUNTER — Other Ambulatory Visit: Payer: Self-pay

## 2020-07-23 ENCOUNTER — Emergency Department (HOSPITAL_BASED_OUTPATIENT_CLINIC_OR_DEPARTMENT_OTHER): Payer: Medicaid Other

## 2020-07-23 ENCOUNTER — Encounter (HOSPITAL_BASED_OUTPATIENT_CLINIC_OR_DEPARTMENT_OTHER): Payer: Self-pay | Admitting: Emergency Medicine

## 2020-07-23 ENCOUNTER — Emergency Department (HOSPITAL_BASED_OUTPATIENT_CLINIC_OR_DEPARTMENT_OTHER)
Admission: EM | Admit: 2020-07-23 | Discharge: 2020-07-23 | Disposition: A | Discharge status: Home / Self Care | Payer: Medicaid Other | Attending: Emergency Medicine | Admitting: Emergency Medicine

## 2020-07-23 DIAGNOSIS — F17210 Nicotine dependence, cigarettes, uncomplicated: Secondary | ICD-10-CM | POA: Insufficient documentation

## 2020-07-23 DIAGNOSIS — R0602 Shortness of breath: Secondary | ICD-10-CM | POA: Diagnosis present

## 2020-07-23 DIAGNOSIS — Z20822 Contact with and (suspected) exposure to covid-19: Secondary | ICD-10-CM | POA: Diagnosis not present

## 2020-07-23 DIAGNOSIS — J4521 Mild intermittent asthma with (acute) exacerbation: Secondary | ICD-10-CM | POA: Insufficient documentation

## 2020-07-23 DIAGNOSIS — Z720 Tobacco use: Secondary | ICD-10-CM

## 2020-07-23 LAB — RESP PANEL BY RT-PCR (FLU A&B, COVID) ARPGX2
Influenza A by PCR: NEGATIVE
Influenza B by PCR: NEGATIVE
SARS Coronavirus 2 by RT PCR: NEGATIVE

## 2020-07-23 MED ORDER — ALBUTEROL SULFATE (2.5 MG/3ML) 0.083% IN NEBU
2.5000 mg | INHALATION_SOLUTION | Freq: Once | RESPIRATORY_TRACT | Status: AC
  Administered 2020-07-23: 06:00:00 2.5 mg via RESPIRATORY_TRACT
  Filled 2020-07-23: qty 3

## 2020-07-23 MED ORDER — ALBUTEROL SULFATE (2.5 MG/3ML) 0.083% IN NEBU: 2.5000 mg | INHALATION_SOLUTION | Freq: Four times a day (QID) | RESPIRATORY_TRACT | 0 refills | Status: DC | PRN

## 2020-07-23 MED ORDER — ALBUTEROL SULFATE HFA 108 (90 BASE) MCG/ACT IN AERS
2.0000 | INHALATION_SPRAY | Freq: Four times a day (QID) | RESPIRATORY_TRACT | 0 refills | Status: DC | PRN
Start: 2020-07-23 — End: 2020-07-23

## 2020-07-23 MED ORDER — DEXAMETHASONE SODIUM PHOSPHATE 10 MG/ML IJ SOLN
10.0000 mg | Freq: Once | INTRAMUSCULAR | Status: AC
  Administered 2020-07-23: 10 mg via INTRAMUSCULAR
  Filled 2020-07-23: qty 1

## 2020-07-23 MED ORDER — ALBUTEROL SULFATE (2.5 MG/3ML) 0.083% IN NEBU
5.0000 mg | INHALATION_SOLUTION | Freq: Once | RESPIRATORY_TRACT | Status: AC
  Administered 2020-07-23: 06:00:00 5 mg via RESPIRATORY_TRACT
  Filled 2020-07-23: qty 6

## 2020-07-23 MED ORDER — IPRATROPIUM-ALBUTEROL 0.5-2.5 (3) MG/3ML IN SOLN
3.0000 mL | Freq: Once | RESPIRATORY_TRACT | Status: AC
  Administered 2020-07-23: 06:00:00 3 mL via RESPIRATORY_TRACT
  Filled 2020-07-23: qty 3

## 2020-07-23 MED ORDER — ALBUTEROL SULFATE HFA 108 (90 BASE) MCG/ACT IN AERS: 2.0000 | INHALATION_SPRAY | Freq: Four times a day (QID) | RESPIRATORY_TRACT | 0 refills | Status: DC | PRN

## 2020-07-23 MED ORDER — METHYLPREDNISOLONE 4 MG PO TBPK: ORAL_TABLET | Freq: Every day | ORAL | 0 refills | Status: DC

## 2020-07-23 NOTE — ED Provider Notes (Addendum)
[facility] EMERGENCY DEPARTMENT Provider Note   CSN: [id number] Arrival date & time: 07/23/20  0536     History Chief Complaint  Patient presents with  . Shortness of Breath    Jeff Savage is a 30 y.o. male.  Pt presents to the ED today with sob.  The pt has a hx of asthma.  He developed sx this morning.  No f/c.  He still smokes.  He used up the rest of his inhaler.          Past Medical History:  Diagnosis Date  . Asthma     There are no problems to display for this patient.   History reviewed. No pertinent surgical history.     History reviewed. No pertinent family history.  Social History   Tobacco Use  . Smoking status: Current Every Day Smoker    Packs/day: 0.50    Types: Cigarettes  . Smokeless tobacco: Never Used  . Tobacco comment: stopped 2 weeks ago as of 08/29/2019  Vaping Use  . Vaping Use: Never used  Substance Use Topics  . Alcohol use: Yes    Comment: occ  . Drug use: Yes    Types: Marijuana    Home Medications Prior to Admission medications   Medication Sig Start Date End Date Taking? Authorizing Provider  methylPREDNISolone (MEDROL DOSEPAK) 4 MG TBPK tablet Take by mouth daily. Day 1:  2 pills at breakfast, 1 pill at lunch, 1 pill after supper, 2 pills at bedtime;Day 2:  1 pill at breakfast, 1 pill at lunch, 1 pill after supper, 2 pills at bedtime;Day 3:  1 pill at breakfast, 1 pill at lunch, 1 pill after supper, 1 pill at bedtime;Day 4:  1 pill at breakfast, 1 pill at lunch, 1 pill at bedtime;Day 5:  1 pill at breakfast, 1 pill at bedtime;Day 6:  1 pill at breakfast 07/23/20  Yes Jacalyn Lefevre, MD  albuterol (PROVENTIL) (2.5 MG/3ML) 0.083% nebulizer solution Take 3 mLs (2.5 mg total) by nebulization every 6 (six) hours as needed for wheezing or shortness of breath. 07/23/20   Jacalyn Lefevre, MD  albuterol (PROVENTIL) (5 MG/ML) 0.5% nebulizer solution Take 0.5 mLs (2.5 mg total) by nebulization every 6 (six) hours as needed for  wheezing or shortness of breath. 08/29/19   Caccavale, Sophia, PA-C  albuterol (VENTOLIN HFA) 108 (90 Base) MCG/ACT inhaler Inhale 2 puffs into the lungs every 6 (six) hours as needed for wheezing or shortness of breath. 07/23/20   Jacalyn Lefevre, MD  Fluticasone-Salmeterol (ADVAIR DISKUS) 100-50 MCG/DOSE AEPB Inhale 1 puff into the lungs 2 (two) times daily. 04/12/20   Arby Barrette, MD  predniSONE (DELTASONE) 20 MG tablet 2 tabs po daily x 4 days 06/07/20   Mesner, Barbara Cower, MD    Allergies    Shellfish allergy  Review of Systems   Review of Systems  Respiratory: Positive for cough, shortness of breath and wheezing.   All other systems reviewed and are negative.   Physical Exam Updated Vital Signs BP (!) 154/98 (BP Location: Left Arm)   Pulse 90   Temp 98.2 F (36.8 C) (Oral)   Resp (!) 22   Ht 5\' 9"  (1.753 m)   Wt 99.8 kg   SpO2 96%   BMI 32.49 kg/m   Physical Exam Vitals and nursing note reviewed.  Constitutional:      Appearance: He is well-developed.  HENT:     Head: Normocephalic and atraumatic.     Mouth/Throat:  Mouth: Mucous membranes are moist.     Pharynx: Oropharynx is clear.  Eyes:     Extraocular Movements: Extraocular movements intact.     Pupils: Pupils are equal, round, and reactive to light.  Cardiovascular:     Rate and Rhythm: Normal rate.  Pulmonary:     Effort: Tachypnea present.     Breath sounds: Wheezing present.  Abdominal:     General: Bowel sounds are normal.     Palpations: Abdomen is soft.  Musculoskeletal:        General: Normal range of motion.     Cervical back: Normal range of motion and neck supple.  Skin:    General: Skin is warm.     Capillary Refill: Capillary refill takes less than 2 seconds.  Neurological:     General: No focal deficit present.     Mental Status: He is alert and oriented to person, place, and time.  Psychiatric:        Mood and Affect: Mood normal.        Behavior: Behavior normal.     ED Results /  Procedures / Treatments   Labs (all labs ordered are listed, but only abnormal results are displayed) Labs Reviewed  RESP PANEL BY RT-PCR (FLU A&B, COVID) [id number]    EKG None  Radiology DG Chest Portable 1 View  Result Date: 07/23/2020 CLINICAL DATA:  Shortness of breath. Pt reports asthma flare up without relief from inhaler. Hx asthma. EXAM: PORTABLE CHEST 1 VIEW COMPARISON:  Chest x-ray 06/30/2020 FINDINGS: The heart size and mediastinal contours are within normal limits. No focal consolidation. No pulmonary edema. No pleural effusion. No pneumothorax. No acute osseous abnormality. IMPRESSION: No active disease. Electronically Signed   By: Tish Frederickson M.D.   On: 07/23/2020 06:31    Procedures Procedures   Medications Ordered in ED Medications  ipratropium-albuterol (DUONEB) 0.5-2.5 (3) MG/3ML nebulizer solution 3 mL (3 mLs Nebulization Given 07/23/20 0554)  albuterol (PROVENTIL) (2.5 MG/3ML) 0.083% nebulizer solution 2.5 mg (2.5 mg Nebulization Given 07/23/20 0554)  albuterol (PROVENTIL) (2.5 MG/3ML) 0.083% nebulizer solution 5 mg (5 mg Nebulization Given 07/23/20 0605)  dexamethasone (DECADRON) injection 10 mg (10 mg Intramuscular Given 07/23/20 8119)    ED Course  I have reviewed the triage vital signs and the nursing notes.  Pertinent labs & imaging results that were available during my care of the patient were reviewed by me and considered in my medical decision making (see chart for details).    MDM Rules/Calculators/A&P                          Pt given several nebs and is feeling much better.  His CXR is clear.  Covid negative.  He is encouraged to stop smoking.  He is to establish with a pcp.  Return if worse.  Jeff Savage was evaluated in Emergency Department on 07/23/2020 for the symptoms described in the history of present illness. He was evaluated in the context of the global COVID-19 pandemic, which necessitated consideration that the patient might be at risk for  infection with the SARS-CoV-2 virus that causes COVID-19. Institutional protocols and algorithms that pertain to the evaluation of patients at risk for COVID-19 are in a state of rapid change based on information released by regulatory bodies including the CDC and federal and state organizations. These policies and algorithms were followed during the patient's care in the ED. Final Clinical Impression(s) / ED Diagnoses Final  diagnoses:  Mild intermittent asthma with exacerbation  Tobacco abuse    Rx / DC Orders ED Discharge Orders         Ordered    albuterol (PROVENTIL) (2.5 MG/3ML) 0.083% nebulizer solution  Every 6 hours PRN,   Status:  Discontinued        07/23/20 0634    albuterol (VENTOLIN HFA) 108 (90 Base) MCG/ACT inhaler  Every 6 hours PRN,   Status:  Discontinued        07/23/20 0634    methylPREDNISolone (MEDROL DOSEPAK) 4 MG TBPK tablet  Daily        07/23/20 0634    albuterol (PROVENTIL) (2.5 MG/3ML) 0.083% nebulizer solution  Every 6 hours PRN        07/23/20 0647    albuterol (VENTOLIN HFA) 108 (90 Base) MCG/ACT inhaler  Every 6 hours PRN        07/23/20 0647           Jacalyn Lefevre, MD 07/23/20 2025    Jacalyn Lefevre, MD 07/23/20 [phone]

## 2020-07-23 NOTE — ED Triage Notes (Signed)
Pt states he has asthma and it flared up over night   Pt states he was fine yesterday  Pt states he used his inhaler without relief  Last used about an hour ago

## 2020-07-23 NOTE — Discharge Instructions (Addendum)
Try to stop smoking. °

## 2021-06-02 ENCOUNTER — Emergency Department (HOSPITAL_BASED_OUTPATIENT_CLINIC_OR_DEPARTMENT_OTHER): Payer: Self-pay

## 2021-06-02 ENCOUNTER — Encounter (HOSPITAL_BASED_OUTPATIENT_CLINIC_OR_DEPARTMENT_OTHER): Payer: Self-pay | Admitting: Emergency Medicine

## 2021-06-02 ENCOUNTER — Other Ambulatory Visit (HOSPITAL_BASED_OUTPATIENT_CLINIC_OR_DEPARTMENT_OTHER): Payer: Self-pay

## 2021-06-02 ENCOUNTER — Other Ambulatory Visit: Payer: Self-pay

## 2021-06-02 ENCOUNTER — Emergency Department (HOSPITAL_BASED_OUTPATIENT_CLINIC_OR_DEPARTMENT_OTHER)
Admission: EM | Admit: 2021-06-02 | Discharge: 2021-06-02 | Disposition: A | Discharge status: Home / Self Care | Payer: Self-pay | Attending: Emergency Medicine | Admitting: Emergency Medicine

## 2021-06-02 DIAGNOSIS — J45901 Unspecified asthma with (acute) exacerbation: Secondary | ICD-10-CM | POA: Insufficient documentation

## 2021-06-02 DIAGNOSIS — Z7951 Long term (current) use of inhaled steroids: Secondary | ICD-10-CM | POA: Insufficient documentation

## 2021-06-02 MED ORDER — ALBUTEROL SULFATE HFA 108 (90 BASE) MCG/ACT IN AERS
2.0000 | INHALATION_SPRAY | Freq: Once | RESPIRATORY_TRACT | Status: AC
  Administered 2021-06-02: 2 via RESPIRATORY_TRACT
  Filled 2021-06-02: qty 6.7

## 2021-06-02 MED ORDER — PREDNISONE 50 MG PO TABS
60.0000 mg | ORAL_TABLET | Freq: Once | ORAL | Status: AC
  Administered 2021-06-02: 60 mg via ORAL
  Filled 2021-06-02: qty 1

## 2021-06-02 MED ORDER — PREDNISONE 20 MG PO TABS
60.0000 mg | ORAL_TABLET | Freq: Every day | ORAL | 0 refills | Status: AC
  Filled 2021-06-02: qty 15, 5d supply, fill #0

## 2021-06-02 MED ORDER — ALBUTEROL SULFATE HFA 108 (90 BASE) MCG/ACT IN AERS
2.0000 | INHALATION_SPRAY | RESPIRATORY_TRACT | 0 refills | Status: DC | PRN
  Filled 2021-06-02: qty 6.7, 25d supply, fill #0

## 2021-06-02 MED ORDER — ALBUTEROL SULFATE (2.5 MG/3ML) 0.083% IN NEBU
INHALATION_SOLUTION | RESPIRATORY_TRACT | Status: AC
  Administered 2021-06-02: 5 mg via RESPIRATORY_TRACT
  Filled 2021-06-02: qty 6

## 2021-06-02 MED ORDER — ALBUTEROL SULFATE (2.5 MG/3ML) 0.083% IN NEBU: 5.0000 mg | INHALATION_SOLUTION | Freq: Once | RESPIRATORY_TRACT | Status: AC

## 2021-06-02 NOTE — ED Triage Notes (Signed)
Pt reports SHOB that started 2 days ago. Pt travelling to see family and did not bring nebulizer.  ?

## 2021-06-02 NOTE — Discharge Instructions (Addendum)
Recommend 2 to 4 puffs of albuterol every 4 hours as needed for the next 24 hours and then as needed.  Take your next dose of steroid tomorrow.  Please return if symptoms worsen. ?

## 2021-06-02 NOTE — ED Provider Notes (Signed)
?[facility] EMERGENCY DEPARTMENT ?Provider Note ? ? ?CSN: [id number] ?Arrival date & time: 06/02/21  1237 ? ?  ? ?History ? ?Chief Complaint  ?Patient presents with  ? Shortness of Breath  ? ? ?Jeff Savage is a 31 y.o. male. ? ?The history is provided by the patient.  ?Shortness of Breath ?Severity:  Mild ?Onset quality:  Gradual ?Duration:  1 day ?Timing:  Intermittent ?Progression:  Waxing and waning ?Chronicity:  New ?Context comment:  Asthma symptoms today.  Did not have nebulizer with him.  He is traveling currently.  No fevers or chills.  Thinks likely from pollen. ?Relieved by:  Nothing ?Worsened by:  Nothing ?Associated symptoms: wheezing   ?Associated symptoms: no abdominal pain, no chest pain, no claudication, no cough, no diaphoresis, no ear pain, no fever, no headaches, no hemoptysis, no neck pain, no PND, no rash, no sore throat, no sputum production, no syncope, no swollen glands and no vomiting   ?Risk factors comment:  Asthma ? ?  ? ?Home Medications ?Prior to Admission medications   ?Medication Sig Start Date End Date Taking? Authorizing Provider  ?predniSONE (DELTASONE) 20 MG tablet Take 3 tablets (60 mg total) by mouth daily for 5 days. 06/02/21 06/07/21 Yes Janit Cutter, DO  ?albuterol (PROVENTIL) (2.5 MG/3ML) 0.083% nebulizer solution Take 3 mLs (2.5 mg total) by nebulization every 6 (six) hours as needed for wheezing or shortness of breath. 07/23/20   Jacalyn Lefevre, MD  ?albuterol (VENTOLIN HFA) 108 (90 Base) MCG/ACT inhaler Inhale 2 puffs into the lungs every 4 (four) hours as needed for wheezing or shortness of breath. 06/02/21   Virgina Norfolk, DO  ?Fluticasone-Salmeterol (ADVAIR DISKUS) 100-50 MCG/DOSE AEPB Inhale 1 puff into the lungs 2 (two) times daily. 04/12/20   Arby Barrette, MD  ?   ? ?Allergies    ?Shellfish allergy   ? ?Review of Systems   ?Review of Systems  ?Constitutional:  Negative for diaphoresis and fever.  ?HENT:  Negative for ear pain and sore throat.    ?Respiratory:  Positive for shortness of breath and wheezing. Negative for cough, hemoptysis and sputum production.   ?Cardiovascular:  Negative for chest pain, claudication, syncope and PND.  ?Gastrointestinal:  Negative for abdominal pain and vomiting.  ?Musculoskeletal:  Negative for neck pain.  ?Skin:  Negative for rash.  ?Neurological:  Negative for headaches.  ? ?Physical Exam ?Updated Vital Signs ?BP (!) 150/100 (BP Location: Right Arm)   Pulse 84   Temp 98.4 ?F (36.9 ?C) (Oral)   Resp (!) 22   SpO2 97%  ?Physical Exam ?Vitals and nursing note reviewed.  ?Constitutional:   ?   General: He is not in acute distress. ?   Appearance: He is well-developed. He is not ill-appearing.  ?HENT:  ?   Head: Normocephalic and atraumatic.  ?   Mouth/Throat:  ?   Mouth: Mucous membranes are moist.  ?Eyes:  ?   Extraocular Movements: Extraocular movements intact.  ?   Conjunctiva/sclera: Conjunctivae normal.  ?   Pupils: Pupils are equal, round, and reactive to light.  ?Cardiovascular:  ?   Rate and Rhythm: Normal rate and regular rhythm.  ?   Pulses: Normal pulses.  ?   Heart sounds: Normal heart sounds. No murmur heard. ?Pulmonary:  ?   Effort: Pulmonary effort is normal. No respiratory distress.  ?   Breath sounds: Wheezing present. No decreased breath sounds.  ?Abdominal:  ?   Palpations: Abdomen is soft.  ?  Tenderness: There is no abdominal tenderness.  ?Musculoskeletal:     ?   General: No swelling.  ?   Cervical back: Normal range of motion and neck supple.  ?   Right lower leg: No edema.  ?   Left lower leg: No edema.  ?Skin: ?   General: Skin is warm and dry.  ?   Capillary Refill: Capillary refill takes less than 2 seconds.  ?Neurological:  ?   Mental Status: He is alert.  ?Psychiatric:     ?   Mood and Affect: Mood normal.  ? ? ?ED Results / Procedures / Treatments   ?Labs ?(all labs ordered are listed, but only abnormal results are displayed) ?Labs Reviewed - No data to display ? ?EKG ?None ? ?Radiology ?No  results found. ? ?Procedures ?Procedures  ? ? ?Medications Ordered in ED ?Medications  ?albuterol (VENTOLIN HFA) 108 (90 Base) MCG/ACT inhaler 2 puff (has no administration in time range)  ?albuterol (PROVENTIL) (2.5 MG/3ML) 0.083% nebulizer solution 5 mg (5 mg Nebulization Given 06/02/21 1252)  ?predniSONE (DELTASONE) tablet 60 mg (60 mg Oral Given 06/02/21 1306)  ? ? ?ED Course/ Medical Decision Making/ A&P ?  ?                        ?Medical Decision Making ?Amount and/or Complexity of Data Reviewed ?Radiology: ordered. ? ?Risk ?Prescription drug management. ? ? ?Jeff Savage is here with wheezing and shortness of breath.  Normal vitals.  No fever.  Asthma symptoms started today.  Traveling from up [place].  Thinks likely the pollen is a trigger.  No infectious symptoms.  No sputum production.  Got breathing treatment while in triage and upon my evaluation has no signs of respiratory distress.  Very fine and expiratory wheeze on exam.  He states he feels much better.  Overall suspect a mild asthma exacerbation.  No concern for pneumonia or other acute process.  Patient given a dose of prednisone.  Given albuterol inhaler for home.  Will prescribe steroids and neck several days.  Discharged in good condition. ? ?This chart was dictated using voice recognition software.  Despite best efforts to proofread,  errors can occur which can change the documentation meaning.  ? ? ? ? ? ? ? ?Final Clinical Impression(s) / ED Diagnoses ?Final diagnoses:  ?Mild asthma with exacerbation, unspecified whether persistent  ? ? ?Rx / DC Orders ?ED Discharge Orders   ? ?      Ordered  ?  predniSONE (DELTASONE) 20 MG tablet  Daily       ? 06/02/21 1306  ?  albuterol (VENTOLIN HFA) 108 (90 Base) MCG/ACT inhaler  Every 4 hours PRN       ? 06/02/21 1306  ? ?  ?  ? ?  ? ? ?  ?Virgina Norfolk, DO ?06/02/21 1307 ? ?

## 2021-06-14 ENCOUNTER — Other Ambulatory Visit: Payer: Self-pay

## 2021-06-14 ENCOUNTER — Encounter (HOSPITAL_BASED_OUTPATIENT_CLINIC_OR_DEPARTMENT_OTHER): Payer: Self-pay | Admitting: Urology

## 2021-06-14 DIAGNOSIS — F17210 Nicotine dependence, cigarettes, uncomplicated: Secondary | ICD-10-CM | POA: Insufficient documentation

## 2021-06-14 DIAGNOSIS — J4541 Moderate persistent asthma with (acute) exacerbation: Secondary | ICD-10-CM | POA: Insufficient documentation

## 2021-06-14 MED ORDER — IPRATROPIUM-ALBUTEROL 0.5-2.5 (3) MG/3ML IN SOLN
RESPIRATORY_TRACT | Status: AC
  Administered 2021-06-14: 3 mL via RESPIRATORY_TRACT
  Filled 2021-06-14: qty 3

## 2021-06-14 MED ORDER — IPRATROPIUM-ALBUTEROL 0.5-2.5 (3) MG/3ML IN SOLN: 3.0000 mL | Freq: Once | RESPIRATORY_TRACT | Status: AC

## 2021-06-14 MED ORDER — ALBUTEROL SULFATE (2.5 MG/3ML) 0.083% IN NEBU
2.5000 mg | INHALATION_SOLUTION | Freq: Once | RESPIRATORY_TRACT | Status: AC
  Administered 2021-06-14: 2.5 mg via RESPIRATORY_TRACT
  Filled 2021-06-14: qty 3

## 2021-06-14 NOTE — ED Triage Notes (Signed)
Shortness of breath since 4/18, states as soon has he finished the prednisone he started getting worse again  ?Using albuterol inhaler with no relief  ?Ins/exp wheezing throughout ?

## 2021-06-15 ENCOUNTER — Emergency Department (HOSPITAL_BASED_OUTPATIENT_CLINIC_OR_DEPARTMENT_OTHER)
Admission: EM | Admit: 2021-06-15 | Discharge: 2021-06-15 | Disposition: A | Discharge status: Home / Self Care | Payer: Self-pay | Attending: Emergency Medicine | Admitting: Emergency Medicine

## 2021-06-15 DIAGNOSIS — J4541 Moderate persistent asthma with (acute) exacerbation: Secondary | ICD-10-CM

## 2021-06-15 DIAGNOSIS — Z720 Tobacco use: Secondary | ICD-10-CM

## 2021-06-15 MED ORDER — PREDNISONE 50 MG PO TABS
60.0000 mg | ORAL_TABLET | Freq: Once | ORAL | Status: AC
  Administered 2021-06-15: 60 mg via ORAL
  Filled 2021-06-15: qty 1

## 2021-06-15 MED ORDER — PREDNISONE 10 MG (21) PO TBPK: ORAL_TABLET | Freq: Every day | ORAL | 0 refills | Status: DC

## 2021-06-15 NOTE — ED Notes (Signed)
Pt A&OX4 ambulatory at d/c with independent steady gait, NAD. Pt verbalized understanding of d/c instructions, prescription and follow up care. 

## 2021-06-15 NOTE — ED Provider Notes (Signed)
?[facility] EMERGENCY DEPARTMENT ?Provider Note ? ? ?CSN: [id number] ?Arrival date & time: 06/14/21  2129 ? ?  ? ?History ? ?Chief Complaint  ?Patient presents with  ? Shortness of Breath  ? ? ?Jeff Savage is a 31 y.o. male. ? ? ?Shortness of Breath ?Severity:  Moderate ?Onset quality:  Gradual ?Timing:  Intermittent ?Chronicity:  Recurrent ?Worsened by:  Nothing ?Associated symptoms: cough and wheezing   ?Associated symptoms: no chest pain and no hemoptysis   ?Patient presents with asthma exacerbation. ?Patient reports he is visiting here from [place].  Since he has been in the area he keeps having cough, wheezing, shortness of breath due to asthma exacerbations. ?Patient is also a tobacco and marijuana smoker.  However he has been too sick to smoke. ?No fevers, no chest pain, no hemoptysis. ?Reports previous history of hospitalization but has never been intubated for asthma ?He plans to drive back to [place] tomorrow ?Patient thinks he needs another round of steroids as this episode flared up once he has completed a recent course of prednisone ?  ? ?Home Medications ?Prior to Admission medications   ?Medication Sig Start Date End Date Taking? Authorizing Provider  ?predniSONE (STERAPRED UNI-PAK 21 TAB) 10 MG (21) TBPK tablet Take by mouth daily. Take 6 tabs by mouth daily  for 2 days, then 5 tabs for 2 days, then 4 tabs for 2 days, then 3 tabs for 2 days, 2 tabs for 2 days, then 1 tab by mouth daily for 2 days 06/15/21  Yes Zadie Rhine, MD  ?albuterol (PROVENTIL) (2.5 MG/3ML) 0.083% nebulizer solution Take 3 mLs (2.5 mg total) by nebulization every 6 (six) hours as needed for wheezing or shortness of breath. 07/23/20   Jacalyn Lefevre, MD  ?albuterol (VENTOLIN HFA) 108 (90 Base) MCG/ACT inhaler Inhale 2 puffs into the lungs every 4 (four) hours as needed for wheezing or shortness of breath. 06/02/21   Virgina Norfolk, DO  ?Fluticasone-Salmeterol (ADVAIR DISKUS) 100-50 MCG/DOSE AEPB Inhale 1  puff into the lungs 2 (two) times daily. 04/12/20   Arby Barrette, MD  ?   ? ?Allergies    ?Shellfish allergy   ? ?Review of Systems   ?Review of Systems  ?Respiratory:  Positive for cough, shortness of breath and wheezing. Negative for hemoptysis.   ?Cardiovascular:  Negative for chest pain.  ? ?Physical Exam ?Updated Vital Signs ?BP (!) 146/97   Pulse (!) 104   Temp 98.5 ?F (36.9 ?C) (Oral)   Resp 17   Ht 1.753 m (5\' 9" )   Wt 100.2 kg   SpO2 95%   BMI 32.64 kg/m?  ?Physical Exam ?CONSTITUTIONAL: Well developed/well nourished ?HEAD: Normocephalic/atraumatic ?EYES: EOMIr ?CV: S1/S2 noted, no murmurs/rubs/gallops noted ?LUNGS: Wheezing bilaterally, no acute distress ?ABDOMEN: soft ?NEURO: Pt is awake/alert/appropriate, moves all extremitiesx4.  No facial droop.   ?EXTREMITIES:  full ROM, no lower extremity edema ?SKIN: warm, color normal ?PSYCH: no abnormalities of mood noted, alert and oriented to situation ? ?ED Results / Procedures / Treatments   ?Labs ?(all labs ordered are listed, but only abnormal results are displayed) ?Labs Reviewed - No data to display ? ?EKG ?None ? ?Radiology ?No results found. ? ?Procedures ?Procedures  ? ? ?Medications Ordered in ED ?Medications  ?ipratropium-albuterol (DUONEB) 0.5-2.5 (3) MG/3ML nebulizer solution 3 mL (3 mLs Nebulization Given 06/14/21 2148)  ?albuterol (PROVENTIL) (2.5 MG/3ML) 0.083% nebulizer solution 2.5 mg (2.5 mg Nebulization Given 06/14/21 2148)  ?predniSONE (DELTASONE) tablet 60 mg (60 mg Oral Given 06/15/21  0151)  ? ? ?ED Course/ Medical Decision Making/ A&P ?  ?                        ?Medical Decision Making ?Risk ?Prescription drug management. ? ? ?Patient presents for asthma exacerbation.  He reports that he needs a longer course of steroids.  He already has an albuterol inhaler.  He plans to return to [place] tomorrow where all of his medications are.  He also plans PCP follow-up. ?I reviewed his recent ER visit as well as previous external  records. ?Patient is overall in no acute distress.  He reports improvement after the albuterol Atrovent earlier. ?Prednisone taper has been prescribed for patient.  Counseled patient on stopping marijuana and tobacco use ? ? ? ? ? ? ? ? ?Final Clinical Impression(s) / ED Diagnoses ?Final diagnoses:  ?Moderate persistent asthma with exacerbation  ?Tobacco abuse  ? ? ?Rx / DC Orders ?ED Discharge Orders   ? ?      Ordered  ?  predniSONE (STERAPRED UNI-PAK 21 TAB) 10 MG (21) TBPK tablet  Daily       ? 06/15/21 0128  ? ?  ?  ? ?  ? ? ?  ?Zadie Rhine, MD ?06/15/21 6945 ? ?

## 2021-06-29 ENCOUNTER — Emergency Department (HOSPITAL_BASED_OUTPATIENT_CLINIC_OR_DEPARTMENT_OTHER)
Admission: EM | Admit: 2021-06-29 | Discharge: 2021-06-29 | Disposition: A | Discharge status: Home / Self Care | Payer: Self-pay | Attending: Emergency Medicine | Admitting: Emergency Medicine

## 2021-06-29 ENCOUNTER — Other Ambulatory Visit: Payer: Self-pay

## 2021-06-29 ENCOUNTER — Encounter (HOSPITAL_BASED_OUTPATIENT_CLINIC_OR_DEPARTMENT_OTHER): Payer: Self-pay | Admitting: *Deleted

## 2021-06-29 DIAGNOSIS — J4541 Moderate persistent asthma with (acute) exacerbation: Secondary | ICD-10-CM

## 2021-06-29 DIAGNOSIS — Z7951 Long term (current) use of inhaled steroids: Secondary | ICD-10-CM | POA: Insufficient documentation

## 2021-06-29 DIAGNOSIS — J4531 Mild persistent asthma with (acute) exacerbation: Secondary | ICD-10-CM | POA: Insufficient documentation

## 2021-06-29 DIAGNOSIS — R0902 Hypoxemia: Secondary | ICD-10-CM | POA: Insufficient documentation

## 2021-06-29 DIAGNOSIS — F17210 Nicotine dependence, cigarettes, uncomplicated: Secondary | ICD-10-CM | POA: Insufficient documentation

## 2021-06-29 LAB — BASIC METABOLIC PANEL
Anion gap: 8 (ref 5–15)
BUN: 19 mg/dL (ref 6–20)
CO2: 23 mmol/L (ref 22–32)
Calcium: 8.8 mg/dL — ABNORMAL LOW (ref 8.9–10.3)
Chloride: 108 mmol/L (ref 98–111)
Creatinine, Ser: 1.13 mg/dL (ref 0.61–1.24)
GFR, Estimated: >60 mL/min (ref >60)
Glucose, Bld: 113 mg/dL — ABNORMAL HIGH (ref 70–99)
Potassium: 3.8 mmol/L (ref 3.5–5.1)
Sodium: 139 mmol/L (ref 135–145)

## 2021-06-29 LAB — CBC WITH DIFFERENTIAL/PLATELET
Abs Immature Granulocytes: 0.01 10*3/uL (ref 0.00–0.07)
Basophils Absolute: 0.1 10*3/uL (ref 0.0–0.1)
Basophils Relative: 1 %
Eosinophils Absolute: 0.5 10*3/uL (ref 0.0–0.5)
Eosinophils Relative: 7 %
HCT: 46.4 % (ref 39.0–52.0)
Hemoglobin: 16 g/dL (ref 13.0–17.0)
Immature Granulocytes: 0 %
Lymphocytes Relative: 32 %
Lymphs Abs: 2.5 10*3/uL (ref 0.7–4.0)
MCH: 29.8 pg (ref 26.0–34.0)
MCHC: 34.5 g/dL (ref 30.0–36.0)
MCV: 86.4 fL (ref 80.0–100.0)
Monocytes Absolute: 0.8 10*3/uL (ref 0.1–1.0)
Monocytes Relative: 10 %
Neutro Abs: 4 10*3/uL (ref 1.7–7.7)
Neutrophils Relative %: 50 %
Platelets: 232 10*3/uL (ref 150–400)
RBC: 5.37 MIL/uL (ref 4.22–5.81)
RDW: 13.9 % (ref 11.5–15.5)
WBC: 7.8 10*3/uL (ref 4.0–10.5)
nRBC: 0 % (ref 0.0–0.2)

## 2021-06-29 MED ORDER — ALBUTEROL (5 MG/ML) CONTINUOUS INHALATION SOLN: 10.0000 mg/h | INHALATION_SOLUTION | Freq: Once | RESPIRATORY_TRACT | Status: AC

## 2021-06-29 MED ORDER — MAGNESIUM SULFATE 2 GM/50ML IV SOLN
2.0000 g | Freq: Once | INTRAVENOUS | Status: AC
  Administered 2021-06-29: 2 g via INTRAVENOUS
  Filled 2021-06-29: qty 50

## 2021-06-29 MED ORDER — ALBUTEROL SULFATE (2.5 MG/3ML) 0.083% IN NEBU
2.5000 mg | INHALATION_SOLUTION | Freq: Once | RESPIRATORY_TRACT | Status: AC
  Administered 2021-06-29: 2.5 mg via RESPIRATORY_TRACT
  Filled 2021-06-29: qty 3

## 2021-06-29 MED ORDER — ALBUTEROL (5 MG/ML) CONTINUOUS INHALATION SOLN
INHALATION_SOLUTION | RESPIRATORY_TRACT | Status: AC
  Administered 2021-06-29: 10 mg/h via RESPIRATORY_TRACT
  Filled 2021-06-29: qty 2

## 2021-06-29 MED ORDER — IPRATROPIUM-ALBUTEROL 0.5-2.5 (3) MG/3ML IN SOLN
3.0000 mL | Freq: Once | RESPIRATORY_TRACT | Status: AC
  Administered 2021-06-29: 3 mL via RESPIRATORY_TRACT
  Filled 2021-06-29: qty 3

## 2021-06-29 MED ORDER — ALBUTEROL (5 MG/ML) CONTINUOUS INHALATION SOLN
10.0000 mg/h | INHALATION_SOLUTION | Freq: Once | RESPIRATORY_TRACT | Status: AC
  Administered 2021-06-29: 10 mg/h via RESPIRATORY_TRACT

## 2021-06-29 MED ORDER — ALBUTEROL (5 MG/ML) CONTINUOUS INHALATION SOLN
INHALATION_SOLUTION | RESPIRATORY_TRACT | Status: AC
  Filled 2021-06-29: qty 2

## 2021-06-29 MED ORDER — METHYLPREDNISOLONE SODIUM SUCC 125 MG IJ SOLR
125.0000 mg | Freq: Once | INTRAMUSCULAR | Status: AC
  Administered 2021-06-29: 125 mg via INTRAVENOUS
  Filled 2021-06-29: qty 2

## 2021-06-29 MED ORDER — BUDESONIDE-FORMOTEROL FUMARATE 160-4.5 MCG/ACT IN AERO: 2.0000 | INHALATION_SPRAY | Freq: Two times a day (BID) | RESPIRATORY_TRACT | 1 refills | Status: DC

## 2021-06-29 MED ORDER — NICOTINE 21 MG/24HR TD PT24: 21.0000 mg | MEDICATED_PATCH | Freq: Every day | TRANSDERMAL | Status: DC

## 2021-06-29 NOTE — ED Provider Notes (Signed)
? ?[facility] ?Provider Note: Jeff Dell, MD, FACEP ? ?CSN: [id number] ?MRN: [id number] ?ARRIVAL: 06/29/21 at 0258 ?ROOM: [facility] ? ? ?CHIEF COMPLAINT  ?Asthma ? ? ?HISTORY OF PRESENT ILLNESS  ?06/29/21 3:11 AM ?Jeff Savage is a 31 y.o. male with a history of asthma.  He he is here with an exacerbation of asthma since yesterday.  He has had increasing shortness of breath and wheezing.  He has not had adequate relief with his inhaler and nebulizer at home.  He had a similar episode for which she was treated in the ED 06/15/2021.  He has had a dry cough and no fever with this.  He is able to speak in full sentences.  He was evaluated by respiratory therapy prior to my evaluation and started on a neb treatment.  He was also noted to be hypoxic on arrival (91% on room air) and was placed on supplemental oxygen.  He is not currently taking his Symbicort, which he left in [place].  He continues to smoke both tobacco and marijuana. ? ? ?Past Medical History:  ?Diagnosis Date  ? Asthma   ? ? ?History reviewed. No pertinent surgical history. ? ?No family history on file. ? ?Social History  ? ?Tobacco Use  ? Smoking status: Every Day  ?  Packs/day: 0.50  ?  Types: Cigarettes  ? Smokeless tobacco: Never  ? Tobacco comments:  ?  stopped 2 weeks ago as of 08/29/2019  ?Vaping Use  ? Vaping Use: Never used  ?Substance Use Topics  ? Alcohol use: Yes  ?  Comment: occ  ? Drug use: Yes  ?  Types: Marijuana  ? ? ?Prior to Admission medications   ?Medication Sig Start Date End Date Taking? Authorizing Provider  ?budesonide-formoterol (SYMBICORT) 160-4.5 MCG/ACT inhaler Inhale 2 puffs into the lungs 2 (two) times daily. 06/29/21  Yes Trenda Corliss, MD  ?albuterol (PROVENTIL) (2.5 MG/3ML) 0.083% nebulizer solution Take 3 mLs (2.5 mg total) by nebulization every 6 (six) hours as needed for wheezing or shortness of breath. 07/23/20   Jacalyn Lefevre, MD  ?albuterol (VENTOLIN HFA) 108 (90 Base) MCG/ACT inhaler Inhale 2  puffs into the lungs every 4 (four) hours as needed for wheezing or shortness of breath. 06/02/21   Virgina Norfolk, DO  ? ? ?Allergies ?Shellfish allergy ? ? ?REVIEW OF SYSTEMS  ?Negative except as noted here or in the History of Present Illness. ? ? ?PHYSICAL EXAMINATION  ?Initial Vital Signs ?Blood pressure (!) 147/99, pulse 94, temperature 97.9 ?F (36.6 ?C), temperature source Oral, SpO2 91 %. ? ?Examination ?General: Well-developed, well-nourished male in no acute distress; appearance consistent with age of record ?HENT: normocephalic; atraumatic ?Eyes: pupils equal, round and reactive to light; extraocular muscles intact ?Neck: supple ?Heart: regular rate and rhythm ?Lungs: Inspiratory and expiratory wheezing with decreased air movement ?Abdomen: soft; nondistended; nontender; bowel sounds present ?Extremities: No deformity; full range of motion; pulses normal ?Neurologic: Awake, alert and oriented; motor function intact in all extremities and symmetric; no facial droop ?Skin: Warm and dry ?Psychiatric: Normal mood and affect ? ? ?RESULTS  ?Summary of this visit's results, reviewed and interpreted by myself: ? ? EKG Interpretation ? ?Date/Time:    ?Ventricular Rate:    ?PR Interval:    ?QRS Duration:   ?QT Interval:    ?QTC Calculation:   ?R Axis:     ?Text Interpretation:   ?  ? ?  ? ?Laboratory Studies: ?Results for orders placed or performed during the hospital  encounter of 06/29/21 (from the past 24 hour(s))  ?CBC with Differential     Status: None  ? Collection Time: 06/29/21  3:20 AM  ?Result Value Ref Range  ? WBC 7.8 4.0 - 10.5 K/uL  ? RBC 5.37 4.22 - 5.81 MIL/uL  ? Hemoglobin 16.0 13.0 - 17.0 g/dL  ? HCT 46.4 39.0 - 52.0 %  ? MCV 86.4 80.0 - 100.0 fL  ? MCH 29.8 26.0 - 34.0 pg  ? MCHC 34.5 30.0 - 36.0 g/dL  ? RDW 13.9 11.5 - 15.5 %  ? Platelets 232 150 - 400 K/uL  ? nRBC 0.0 0.0 - 0.2 %  ? Neutrophils Relative % 50 %  ? Neutro Abs 4.0 1.7 - 7.7 K/uL  ? Lymphocytes Relative 32 %  ? Lymphs Abs 2.5 0.7 -  4.0 K/uL  ? Monocytes Relative 10 %  ? Monocytes Absolute 0.8 0.1 - 1.0 K/uL  ? Eosinophils Relative 7 %  ? Eosinophils Absolute 0.5 0.0 - 0.5 K/uL  ? Basophils Relative 1 %  ? Basophils Absolute 0.1 0.0 - 0.1 K/uL  ? Immature Granulocytes 0 %  ? Abs Immature Granulocytes 0.01 0.00 - 0.07 K/uL  ?Basic metabolic panel     Status: Abnormal  ? Collection Time: 06/29/21  3:20 AM  ?Result Value Ref Range  ? Sodium 139 135 - 145 mmol/L  ? Potassium 3.8 3.5 - 5.1 mmol/L  ? Chloride 108 98 - 111 mmol/L  ? CO2 23 22 - 32 mmol/L  ? Glucose, Bld 113 (H) 70 - 99 mg/dL  ? BUN 19 6 - 20 mg/dL  ? Creatinine, Ser 1.13 0.61 - 1.24 mg/dL  ? Calcium 8.8 (L) 8.9 - 10.3 mg/dL  ? GFR, Estimated >60 >60 mL/min  ? Anion gap 8 5 - 15  ? ?Imaging Studies: ?No results found. ? ?ED COURSE and MDM  ?Nursing notes, initial and subsequent vitals signs, including pulse oximetry, reviewed and interpreted by myself. ? ?Vitals:  ? 06/29/21 0417 06/29/21 0426 06/29/21 0500 06/29/21 0526  ?BP:   138/88   ?Pulse:   (!) 107   ?Resp:   (!) 30   ?Temp:      ?TempSrc:      ?SpO2: 94% 93% 93% 91%  ?Weight:      ? ?Medications  ?nicotine (NICODERM CQ - dosed in mg/24 hours) patch 21 mg (has no administration in time range)  ?ipratropium-albuterol (DUONEB) 0.5-2.5 (3) MG/3ML nebulizer solution 3 mL (3 mLs Nebulization Given 06/29/21 0317)  ?albuterol (PROVENTIL) (2.5 MG/3ML) 0.083% nebulizer solution 2.5 mg (2.5 mg Nebulization Given 06/29/21 0317)  ?methylPREDNISolone sodium succinate (SOLU-MEDROL) 125 mg/2 mL injection 125 mg (125 mg Intravenous Given 06/29/21 0331)  ?magnesium sulfate IVPB 2 g 50 mL (0 g Intravenous Stopped 06/29/21 0451)  ?albuterol (PROVENTIL,VENTOLIN) solution continuous neb (10 mg/hr Nebulization Given 06/29/21 0335)  ?albuterol (PROVENTIL,VENTOLIN) solution continuous neb (10 mg/hr Nebulization Given 06/29/21 0423)  ? ?In addition to neb treatment, patient also ordered IV Solu-Medrol and magnesium sulfate. ? ?6:00 AM ?Patient feeling better  after 2 additional continuous albuterol neb treatments.  His air movement is improved and his wheezing is minimal.  His oxygen saturation is 93% on room air.  Patient states he feels he is able to go home at this time.  He would like a refill of his Symbicort.  He was advised to return if his symptoms worsen and we would consider admission.  He was advised to discontinue smoking of any kind. ? ? ?  PROCEDURES  ?Procedures ? ? ?ED DIAGNOSES  ? ?  ICD-10-CM   ?1. Moderate persistent asthma with exacerbation  J45.41   ?  ? ? ? ?  ?Paula Libra, MD ?06/29/21 3546 ? ?

## 2021-06-29 NOTE — ED Triage Notes (Signed)
Pt has been having asthma exacerbation intermittently this month and yesterday it became worse with increasing sob.  No relief with inhaler and nebulizer at home.  Pt is able to speak in full sentences.  Pt has a dry cough, no fever.   ?

## 2021-09-14 ENCOUNTER — Encounter (HOSPITAL_COMMUNITY)
Admission: EM | Disposition: A | Discharge status: Home / Self Care | Payer: Self-pay | Source: Home / Self Care | Attending: Emergency Medicine

## 2021-09-14 ENCOUNTER — Emergency Department (HOSPITAL_BASED_OUTPATIENT_CLINIC_OR_DEPARTMENT_OTHER): Payer: Self-pay

## 2021-09-14 ENCOUNTER — Emergency Department (HOSPITAL_BASED_OUTPATIENT_CLINIC_OR_DEPARTMENT_OTHER): Payer: Self-pay | Admitting: Registered Nurse

## 2021-09-14 ENCOUNTER — Ambulatory Visit (HOSPITAL_BASED_OUTPATIENT_CLINIC_OR_DEPARTMENT_OTHER)
Admission: EM | Admit: 2021-09-14 | Discharge: 2021-09-14 | Disposition: A | Discharge status: Home / Self Care | Payer: Self-pay | Attending: Emergency Medicine | Admitting: Emergency Medicine

## 2021-09-14 ENCOUNTER — Encounter (HOSPITAL_BASED_OUTPATIENT_CLINIC_OR_DEPARTMENT_OTHER): Payer: Self-pay

## 2021-09-14 ENCOUNTER — Inpatient Hospital Stay: Admit: 2021-09-14 | Payer: Self-pay | Admitting: Surgery

## 2021-09-14 ENCOUNTER — Emergency Department (HOSPITAL_COMMUNITY): Payer: Self-pay | Admitting: Registered Nurse

## 2021-09-14 DIAGNOSIS — K8064 Calculus of gallbladder and bile duct with chronic cholecystitis without obstruction: Secondary | ICD-10-CM | POA: Insufficient documentation

## 2021-09-14 DIAGNOSIS — J45909 Unspecified asthma, uncomplicated: Secondary | ICD-10-CM | POA: Insufficient documentation

## 2021-09-14 DIAGNOSIS — K810 Acute cholecystitis: Secondary | ICD-10-CM

## 2021-09-14 DIAGNOSIS — F17210 Nicotine dependence, cigarettes, uncomplicated: Secondary | ICD-10-CM | POA: Insufficient documentation

## 2021-09-14 DIAGNOSIS — K8020 Calculus of gallbladder without cholecystitis without obstruction: Secondary | ICD-10-CM

## 2021-09-14 HISTORY — PX: CHOLECYSTECTOMY: SHX55

## 2021-09-14 LAB — CBC WITH DIFFERENTIAL/PLATELET
Abs Immature Granulocytes: 0.02 10*3/uL (ref 0.00–0.07)
Basophils Absolute: 0.1 10*3/uL (ref 0.0–0.1)
Basophils Relative: 1 %
Eosinophils Absolute: 0.6 10*3/uL — ABNORMAL HIGH (ref 0.0–0.5)
Eosinophils Relative: 7 %
HCT: 45.2 % (ref 39.0–52.0)
Hemoglobin: 15.7 g/dL (ref 13.0–17.0)
Immature Granulocytes: 0 %
Lymphocytes Relative: 42 %
Lymphs Abs: 4 10*3/uL (ref 0.7–4.0)
MCH: 29.9 pg (ref 26.0–34.0)
MCHC: 34.7 g/dL (ref 30.0–36.0)
MCV: 86.1 fL (ref 80.0–100.0)
Monocytes Absolute: 1.1 10*3/uL — ABNORMAL HIGH (ref 0.1–1.0)
Monocytes Relative: 11 %
Neutro Abs: 3.6 10*3/uL (ref 1.7–7.7)
Neutrophils Relative %: 39 %
Platelets: 252 10*3/uL (ref 150–400)
RBC: 5.25 MIL/uL (ref 4.22–5.81)
RDW: 12.9 % (ref 11.5–15.5)
WBC: 9.4 10*3/uL (ref 4.0–10.5)
nRBC: 0 % (ref 0.0–0.2)

## 2021-09-14 LAB — COMPREHENSIVE METABOLIC PANEL
ALT: 21 U/L (ref 0–44)
AST: 18 U/L (ref 15–41)
Albumin: 3.9 g/dL (ref 3.5–5.0)
Alkaline Phosphatase: 53 U/L (ref 38–126)
Anion gap: 7 (ref 5–15)
BUN: 15 mg/dL (ref 6–20)
CO2: 23 mmol/L (ref 22–32)
Calcium: 8.8 mg/dL — ABNORMAL LOW (ref 8.9–10.3)
Chloride: 108 mmol/L (ref 98–111)
Creatinine, Ser: 1.12 mg/dL (ref 0.61–1.24)
GFR, Estimated: >60 mL/min (ref >60)
Glucose, Bld: 144 mg/dL — ABNORMAL HIGH (ref 70–99)
Potassium: 3.6 mmol/L (ref 3.5–5.1)
Sodium: 138 mmol/L (ref 135–145)
Total Bilirubin: 0.8 mg/dL (ref 0.3–1.2)
Total Protein: 7.5 g/dL (ref 6.5–8.1)

## 2021-09-14 LAB — LIPASE, BLOOD: Lipase: 23 U/L (ref 11–51)

## 2021-09-14 SURGERY — LAPAROSCOPIC CHOLECYSTECTOMY WITH INTRAOPERATIVE CHOLANGIOGRAM
Anesthesia: General | Site: Abdomen

## 2021-09-14 MED ORDER — OXYCODONE HCL 5 MG PO TABS
ORAL_TABLET | ORAL | Status: AC
  Filled 2021-09-14: qty 1

## 2021-09-14 MED ORDER — DEXMEDETOMIDINE HCL IN NACL 80 MCG/20ML IV SOLN
INTRAVENOUS | Status: AC
  Filled 2021-09-14: qty 20

## 2021-09-14 MED ORDER — BUPIVACAINE-EPINEPHRINE 0.25% -1:200000 IJ SOLN
INTRAMUSCULAR | Status: DC | PRN
  Administered 2021-09-14: 30 mL

## 2021-09-14 MED ORDER — SUCCINYLCHOLINE CHLORIDE 200 MG/10ML IV SOSY
PREFILLED_SYRINGE | INTRAVENOUS | Status: AC
  Filled 2021-09-14: qty 10

## 2021-09-14 MED ORDER — ONDANSETRON HCL 4 MG/2ML IJ SOLN
4.0000 mg | Freq: Once | INTRAMUSCULAR | Status: AC
  Administered 2021-09-14: 4 mg via INTRAVENOUS
  Filled 2021-09-14: qty 2

## 2021-09-14 MED ORDER — DEXAMETHASONE SODIUM PHOSPHATE 10 MG/ML IJ SOLN
INTRAMUSCULAR | Status: AC
  Filled 2021-09-14: qty 1

## 2021-09-14 MED ORDER — SUGAMMADEX SODIUM 200 MG/2ML IV SOLN
INTRAVENOUS | Status: DC | PRN
  Administered 2021-09-14: 200 mg via INTRAVENOUS

## 2021-09-14 MED ORDER — HYDROMORPHONE HCL 1 MG/ML IJ SOLN
0.2500 mg | INTRAMUSCULAR | Status: DC | PRN
  Administered 2021-09-14: 0.5 mg via INTRAVENOUS

## 2021-09-14 MED ORDER — ROCURONIUM BROMIDE 10 MG/ML (PF) SYRINGE
PREFILLED_SYRINGE | INTRAVENOUS | Status: DC | PRN
  Administered 2021-09-14: 50 mg via INTRAVENOUS

## 2021-09-14 MED ORDER — LABETALOL HCL 5 MG/ML IV SOLN
INTRAVENOUS | Status: AC
  Filled 2021-09-14: qty 4

## 2021-09-14 MED ORDER — KETOROLAC TROMETHAMINE 30 MG/ML IJ SOLN
INTRAMUSCULAR | Status: AC
  Filled 2021-09-14: qty 1

## 2021-09-14 MED ORDER — MORPHINE SULFATE (PF) 4 MG/ML IV SOLN
4.0000 mg | Freq: Once | INTRAVENOUS | Status: AC
  Administered 2021-09-14: 4 mg via INTRAVENOUS
  Filled 2021-09-14: qty 1

## 2021-09-14 MED ORDER — ROCURONIUM BROMIDE 10 MG/ML (PF) SYRINGE
PREFILLED_SYRINGE | INTRAVENOUS | Status: AC
  Filled 2021-09-14: qty 10

## 2021-09-14 MED ORDER — CHLORHEXIDINE GLUCONATE 0.12 % MT SOLN
15.0000 mL | Freq: Once | OROMUCOSAL | Status: AC
  Administered 2021-09-14: 15 mL via OROMUCOSAL

## 2021-09-14 MED ORDER — SUCCINYLCHOLINE CHLORIDE 200 MG/10ML IV SOSY
PREFILLED_SYRINGE | INTRAVENOUS | Status: DC | PRN
  Administered 2021-09-14: 100 mg via INTRAVENOUS

## 2021-09-14 MED ORDER — LACTATED RINGERS IV SOLN: INTRAVENOUS | Status: DC

## 2021-09-14 MED ORDER — ONDANSETRON HCL 4 MG/2ML IJ SOLN
INTRAMUSCULAR | Status: AC
  Filled 2021-09-14: qty 2

## 2021-09-14 MED ORDER — HYDROMORPHONE HCL 1 MG/ML IJ SOLN
1.0000 mg | Freq: Once | INTRAMUSCULAR | Status: AC
  Administered 2021-09-14: 1 mg via INTRAVENOUS
  Filled 2021-09-14: qty 1

## 2021-09-14 MED ORDER — HYDROMORPHONE HCL 1 MG/ML IJ SOLN
INTRAMUSCULAR | Status: AC
  Administered 2021-09-14: 0.5 mg via INTRAVENOUS
  Filled 2021-09-14: qty 1

## 2021-09-14 MED ORDER — ONDANSETRON HCL 4 MG/2ML IJ SOLN
INTRAMUSCULAR | Status: DC | PRN
  Administered 2021-09-14: 4 mg via INTRAVENOUS

## 2021-09-14 MED ORDER — ORAL CARE MOUTH RINSE: 15.0000 mL | Freq: Once | OROMUCOSAL | Status: AC

## 2021-09-14 MED ORDER — FENTANYL CITRATE (PF) 250 MCG/5ML IJ SOLN
INTRAMUSCULAR | Status: AC
  Filled 2021-09-14: qty 5

## 2021-09-14 MED ORDER — DEXMEDETOMIDINE (PRECEDEX) IN NS 20 MCG/5ML (4 MCG/ML) IV SYRINGE
PREFILLED_SYRINGE | INTRAVENOUS | Status: DC | PRN
  Administered 2021-09-14: 4 ug via INTRAVENOUS
  Administered 2021-09-14: 12 ug via INTRAVENOUS

## 2021-09-14 MED ORDER — MIDAZOLAM HCL 5 MG/5ML IJ SOLN
INTRAMUSCULAR | Status: DC | PRN
  Administered 2021-09-14: 2 mg via INTRAVENOUS

## 2021-09-14 MED ORDER — DEXAMETHASONE SODIUM PHOSPHATE 10 MG/ML IJ SOLN
INTRAMUSCULAR | Status: DC | PRN
  Administered 2021-09-14: 10 mg via INTRAVENOUS

## 2021-09-14 MED ORDER — PANTOPRAZOLE SODIUM 40 MG IV SOLR
40.0000 mg | Freq: Once | INTRAVENOUS | Status: AC
Start: 2021-09-14 — End: 2021-09-14
  Administered 2021-09-14: 40 mg via INTRAVENOUS
  Filled 2021-09-14: qty 10

## 2021-09-14 MED ORDER — 0.9 % SODIUM CHLORIDE (POUR BTL) OPTIME
TOPICAL | Status: DC | PRN
  Administered 2021-09-14: 1000 mL

## 2021-09-14 MED ORDER — OXYCODONE HCL 5 MG/5ML PO SOLN: 5.0000 mg | Freq: Once | ORAL | Status: AC | PRN

## 2021-09-14 MED ORDER — KETOROLAC TROMETHAMINE 30 MG/ML IJ SOLN
30.0000 mg | Freq: Once | INTRAMUSCULAR | Status: AC | PRN
  Administered 2021-09-14: 30 mg via INTRAVENOUS

## 2021-09-14 MED ORDER — OXYCODONE HCL 5 MG PO TABS: 5.0000 mg | ORAL_TABLET | ORAL | 0 refills | Status: DC | PRN

## 2021-09-14 MED ORDER — METOCLOPRAMIDE HCL 5 MG/ML IJ SOLN
10.0000 mg | Freq: Once | INTRAMUSCULAR | Status: AC
Start: 2021-09-14 — End: 2021-09-14
  Administered 2021-09-14: 10 mg via INTRAVENOUS
  Filled 2021-09-14: qty 2

## 2021-09-14 MED ORDER — PROPOFOL 10 MG/ML IV BOLUS
INTRAVENOUS | Status: DC | PRN
  Administered 2021-09-14: 130 mg via INTRAVENOUS

## 2021-09-14 MED ORDER — OXYCODONE HCL 5 MG PO TABS
5.0000 mg | ORAL_TABLET | Freq: Once | ORAL | Status: AC | PRN
  Administered 2021-09-14: 5 mg via ORAL

## 2021-09-14 MED ORDER — SODIUM CHLORIDE 0.9 % IV SOLN: INTRAVENOUS | Status: DC | PRN

## 2021-09-14 MED ORDER — ONDANSETRON HCL 4 MG/2ML IJ SOLN: 4.0000 mg | Freq: Once | INTRAMUSCULAR | Status: DC | PRN

## 2021-09-14 MED ORDER — LIDOCAINE 2% (20 MG/ML) 5 ML SYRINGE
INTRAMUSCULAR | Status: DC | PRN
  Administered 2021-09-14: 100 mg via INTRAVENOUS

## 2021-09-14 MED ORDER — BUPIVACAINE-EPINEPHRINE (PF) 0.25% -1:200000 IJ SOLN
INTRAMUSCULAR | Status: AC
  Filled 2021-09-14: qty 30

## 2021-09-14 MED ORDER — FENTANYL CITRATE (PF) 100 MCG/2ML IJ SOLN
INTRAMUSCULAR | Status: DC | PRN
  Administered 2021-09-14 (×2): 50 ug via INTRAVENOUS
  Administered 2021-09-14: 100 ug via INTRAVENOUS
  Administered 2021-09-14: 50 ug via INTRAVENOUS

## 2021-09-14 MED ORDER — SODIUM CHLORIDE 0.9 % IV BOLUS
500.0000 mL | Freq: Once | INTRAVENOUS | Status: AC
  Administered 2021-09-14: 500 mL via INTRAVENOUS

## 2021-09-14 MED ORDER — LACTATED RINGERS IV SOLN
INTRAVENOUS | Status: DC | PRN
  Administered 2021-09-14: 1000 mL

## 2021-09-14 MED ORDER — LABETALOL HCL 5 MG/ML IV SOLN
5.0000 mg | INTRAVENOUS | Status: DC | PRN
  Administered 2021-09-14: 5 mg via INTRAVENOUS

## 2021-09-14 MED ORDER — SODIUM CHLORIDE 0.9 % IV SOLN
2.0000 g | Freq: Once | INTRAVENOUS | Status: AC
  Administered 2021-09-14: 2 g via INTRAVENOUS
  Filled 2021-09-14: qty 20

## 2021-09-14 MED ORDER — MIDAZOLAM HCL 2 MG/2ML IJ SOLN
INTRAMUSCULAR | Status: AC
  Filled 2021-09-14: qty 2

## 2021-09-14 SURGICAL SUPPLY — 42 items
APPLIER CLIP ROT 10 11.4 M/L (STAPLE) ×2
BAG COUNTER SPONGE SURGICOUNT (BAG) IMPLANT
CABLE HIGH FREQUENCY MONO STRZ (ELECTRODE) ×2 IMPLANT
CATH URETL OPEN 5X70 (CATHETERS) IMPLANT
CHLORAPREP W/TINT 26 (MISCELLANEOUS) ×2 IMPLANT
CLIP APPLIE ROT 10 11.4 M/L (STAPLE) ×1 IMPLANT
COVER MAYO STAND XLG (MISCELLANEOUS) ×2 IMPLANT
COVER SURGICAL LIGHT HANDLE (MISCELLANEOUS) ×2 IMPLANT
DERMABOND ADVANCED (GAUZE/BANDAGES/DRESSINGS) ×1
DERMABOND ADVANCED .7 DNX12 (GAUZE/BANDAGES/DRESSINGS) ×1 IMPLANT
DRAPE C-ARM 42X120 X-RAY (DRAPES) IMPLANT
ELECT REM PT RETURN 15FT ADLT (MISCELLANEOUS) ×2 IMPLANT
ENDOLOOP SUT PDS II  0 18 (SUTURE) ×1
ENDOLOOP SUT PDS II 0 18 (SUTURE) ×1 IMPLANT
GLOVE BIO SURGEON STRL SZ7.5 (GLOVE) ×2 IMPLANT
GLOVE BIOGEL PI IND STRL 8 (GLOVE) ×1 IMPLANT
GLOVE BIOGEL PI INDICATOR 8 (GLOVE) ×1
GOWN STRL REUS W/ TWL XL LVL3 (GOWN DISPOSABLE) ×2 IMPLANT
GOWN STRL REUS W/TWL XL LVL3 (GOWN DISPOSABLE) ×2
GRASPER SUT TROCAR 14GX15 (MISCELLANEOUS) ×1 IMPLANT
HEMOSTAT SNOW SURGICEL 2X4 (HEMOSTASIS) IMPLANT
IRRIG SUCT STRYKERFLOW 2 WTIP (MISCELLANEOUS) ×2
IRRIGATION SUCT STRKRFLW 2 WTP (MISCELLANEOUS) ×1 IMPLANT
IV CATH 14GX2 1/4 (CATHETERS) ×2 IMPLANT
KIT BASIN OR (CUSTOM PROCEDURE TRAY) ×2 IMPLANT
KIT TURNOVER KIT A (KITS) IMPLANT
NDL INSUFFLATION 14GA 120MM (NEEDLE) ×1 IMPLANT
NEEDLE INSUFFLATION 14GA 120MM (NEEDLE) ×2 IMPLANT
PENCIL SMOKE EVACUATOR (MISCELLANEOUS) IMPLANT
POUCH RETRIEVAL ECOSAC 10 (ENDOMECHANICALS) ×1 IMPLANT
POUCH RETRIEVAL ECOSAC 10MM (ENDOMECHANICALS) ×1
SCISSORS LAP 5X35 DISP (ENDOMECHANICALS) ×2 IMPLANT
SET TUBE SMOKE EVAC HIGH FLOW (TUBING) ×2 IMPLANT
SLEEVE Z-THREAD 5X100MM (TROCAR) ×4 IMPLANT
SPIKE FLUID TRANSFER (MISCELLANEOUS) ×2 IMPLANT
STOPCOCK 4 WAY LG BORE MALE ST (IV SETS) IMPLANT
SUT MNCRL AB 4-0 PS2 18 (SUTURE) ×2 IMPLANT
TOWEL OR 17X26 10 PK STRL BLUE (TOWEL DISPOSABLE) ×2 IMPLANT
TOWEL OR NON WOVEN STRL DISP B (DISPOSABLE) IMPLANT
TRAY LAPAROSCOPIC (CUSTOM PROCEDURE TRAY) ×2 IMPLANT
TROCAR ADV FIXATION 12X100MM (TROCAR) ×2 IMPLANT
TROCAR Z-THREAD OPTICAL 5X100M (TROCAR) ×2 IMPLANT

## 2021-09-14 NOTE — ED Notes (Signed)
Last marijuana use yesterday.

## 2021-09-14 NOTE — Anesthesia Procedure Notes (Signed)
Procedure Name: Intubation Date/Time: 09/14/2021 1:37 PM  Performed by: Victoriano Lain, CRNAPre-anesthesia Checklist: Patient identified, Emergency Drugs available, Suction available, Patient being monitored and Timeout performed Patient Re-evaluated:Patient Re-evaluated prior to induction Oxygen Delivery Method: Circle system utilized Preoxygenation: Pre-oxygenation with 100% oxygen Induction Type: Rapid sequence, Cricoid Pressure applied and IV induction Laryngoscope Size: Mac and 4 Grade View: Grade I Tube type: Oral Tube size: 7.5 mm Number of attempts: 1 Airway Equipment and Method: Stylet Placement Confirmation: ETT inserted through vocal cords under direct vision, positive ETCO2 and breath sounds checked- equal and bilateral Secured at: 22 cm Tube secured with: Tape Dental Injury: Teeth and Oropharynx as per pre-operative assessment  Comments: Smooth RSI with cricoid pressure by Dr Valma Cava. Grade 1 view. ATOI. BBS=. +ETCO2

## 2021-09-14 NOTE — Discharge Instructions (Signed)
[facility] ° °Please arrive at least 30 min before your appointment to complete your check in paperwork.  If you are unable to arrive 30 min prior to your appointment time we may have to cancel or reschedule you. °LAPAROSCOPIC SURGERY: POST OP INSTRUCTIONS °Always review your discharge instruction sheet given to you by the facility where your surgery was performed. °IF YOU HAVE DISABILITY OR FAMILY LEAVE FORMS, YOU MUST BRING THEM TO THE OFFICE FOR PROCESSING.   °DO NOT GIVE THEM TO YOUR DOCTOR. ° °PAIN CONTROL ° °First take acetaminophen (Tylenol) AND/or ibuprofen (Advil) to control your pain after surgery.  Follow directions on package.  Taking acetaminophen (Tylenol) and/or ibuprofen (Advil) regularly after surgery will help to control your pain and lower the amount of prescription pain medication you may need.  You should not take more than 4,000 mg (4 grams) of acetaminophen (Tylenol) in 24 hours.  You should not take ibuprofen (Advil), aleve, motrin, naprosyn or other NSAIDS if you have a history of stomach ulcers or chronic kidney disease.  °A prescription for pain medication may be given to you upon discharge.  Take your pain medication as prescribed, if you still have uncontrolled pain after taking acetaminophen (Tylenol) or ibuprofen (Advil). °Use ice packs to help control pain. °If you need a refill on your pain medication, please contact your pharmacy.  They will contact our office to request authorization. Prescriptions will not be filled after 5pm or on week-ends. ° °HOME MEDICATIONS °Take your usually prescribed medications unless otherwise directed. ° °DIET °You should follow a light diet the first few days after arrival home.  Be sure to include lots of fluids daily. Avoid fatty, fried foods.  ° °CONSTIPATION °It is common to experience some constipation after surgery and if you are taking pain medication.  Increasing fluid intake and taking a stool softener (such as Colace)  will usually help or prevent this problem from occurring.  A mild laxative (Milk of Magnesia or Miralax) should be taken according to package instructions if there are no bowel movements after 48 hours. ° °WOUND/INCISION CARE °Most patients will experience some swelling and bruising in the area of the incisions.  Ice packs will help.  Swelling and bruising can take several days to resolve.  °Unless discharge instructions indicate otherwise, follow guidelines below  °STERI-STRIPS - you may remove your outer bandages 48 hours after surgery, and you may shower at that time.  You have steri-strips (small skin tapes) in place directly over the incision.  These strips should be left on the skin for 7-10 days.   °DERMABOND/SKIN GLUE - you may shower in 24 hours.  The glue will flake off over the next 2-3 weeks. °Any sutures or staples will be removed at the office during your follow-up visit. ° °ACTIVITIES °You may resume regular (light) daily activities beginning the next day--such as daily self-care, walking, climbing stairs--gradually increasing activities as tolerated.  You may have sexual intercourse when it is comfortable.  Refrain from any heavy lifting or straining until approved by your doctor. °You may drive when you are no longer taking prescription pain medication, you can comfortably wear a seatbelt, and you can safely maneuver your car and apply brakes. ° °FOLLOW-UP °You should see your doctor in the office for a follow-up appointment approximately 2-3 weeks after your surgery.  You should have been given your post-op/follow-up appointment when your surgery was scheduled.  If you did not receive a post-op/follow-up appointment, make sure   that you call for this appointment within a day or two after you arrive home to insure a convenient appointment time. ° ° °WHEN TO CALL YOUR DOCTOR: °Fever over 101.0 °Inability to urinate °Continued bleeding from incision. °Increased pain, redness, or drainage from the  incision. °Increasing abdominal pain ° °The clinic staff is available to answer your questions during regular business hours.  Please don’t hesitate to call and ask to speak to one of the nurses for clinical concerns.  If you have a medical emergency, go to the nearest emergency room or call 911.  A surgeon from [facility] is always on call at the hospital. °[address], [address], [place], [place]  [address] ? [address] [address], [place], [place]   [address] °[phone] ? [phone] ? FAX [phone] ° ° ° ° °Managing Your Pain After Surgery Without Opioids ° ° ° °Thank you for participating in our program to help patients manage their pain after surgery without opioids. This is part of our effort to provide you with the best care possible, without exposing you or your family to the risk that opioids pose. ° °What pain can I expect after surgery? °You can expect to have some pain after surgery. This is normal. The pain is typically worse the day after surgery, and quickly begins to get better. °Many studies have found that many patients are able to manage their pain after surgery with Over-the-Counter (OTC) medications such as Tylenol and Motrin. If you have a condition that does not allow you to take Tylenol or Motrin, notify your surgical team. ° °How will I manage my pain? °The best strategy for controlling your pain after surgery is around the clock pain control with Tylenol (acetaminophen) and Motrin (ibuprofen or Advil). Alternating these medications with each other allows you to maximize your pain control. In addition to Tylenol and Motrin, you can use heating pads or ice packs on your incisions to help reduce your pain. ° °How will I alternate your regular strength over-the-counter pain medication? °You will take a dose of pain medication every three hours. °Start by taking 650 mg of Tylenol (2 pills of 325 mg) °3 hours later take 600 mg of Motrin (3 pills of 200 mg) °3 hours after  taking the Motrin take 650 mg of Tylenol °3 hours after that take 600 mg of Motrin. ° ° °- 1 - ° °See example - if your first dose of Tylenol is at 12:00 PM ° ° °12:00 PM Tylenol 650 mg (2 pills of 325 mg)  °3:00 PM Motrin 600 mg (3 pills of 200 mg)  °6:00 PM Tylenol 650 mg (2 pills of 325 mg)  °9:00 PM Motrin 600 mg (3 pills of 200 mg)  °Continue alternating every 3 hours  ° °We recommend that you follow this schedule around-the-clock for at least 3 days after surgery, or until you feel that it is no longer needed. Use the table on the last page of this handout to keep track of the medications you are taking. °Important: °Do not take more than 3000mg of Tylenol or 3200mg of Motrin in a 24-hour period. °Do not take ibuprofen/Motrin if you have a history of bleeding stomach ulcers, severe kidney disease, &/or actively taking a blood thinner ° °What if I still have pain? °If you have pain that is not controlled with the over-the-counter pain medications (Tylenol and Motrin or Advil) you might have what we call “breakthrough” pain. You will receive a prescription   for a small amount of an opioid pain medication such as Oxycodone, Tramadol, or Tylenol with Codeine. Use these opioid pills in the first 24 hours after surgery if you have breakthrough pain. Do not take more than 1 pill every 4-6 hours. ° °If you still have uncontrolled pain after using all opioid pills, don't hesitate to call our staff using the number provided. We will help make sure you are managing your pain in the best way possible, and if necessary, we can provide a prescription for additional pain medication. ° ° °Day 1   ° °Time  °Name of Medication Number of pills taken  °Amount of Acetaminophen  °Pain Level  ° °Comments  °AM PM       °AM PM       °AM PM       °AM PM       °AM PM       °AM PM       °AM PM       °AM PM       °Total Daily amount of Acetaminophen °Do not take more than  3,000 mg per day    ° ° °Day 2   ° °Time  °Name of Medication  Number of pills °taken  °Amount of Acetaminophen  °Pain Level  ° °Comments  °AM PM       °AM PM       °AM PM       °AM PM       °AM PM       °AM PM       °AM PM       °AM PM       °Total Daily amount of Acetaminophen °Do not take more than  3,000 mg per day    ° ° °Day 3   ° °Time  °Name of Medication Number of pills taken  °Amount of Acetaminophen  °Pain Level  ° °Comments  °AM PM       °AM PM       °AM PM       °AM PM       ° ° ° °AM PM       °AM PM       °AM PM       °AM PM       °Total Daily amount of Acetaminophen °Do not take more than  3,000 mg per day    ° ° °Day 4   ° °Time  °Name of Medication Number of pills taken  °Amount of Acetaminophen  °Pain Level  ° °Comments  °AM PM       °AM PM       °AM PM       °AM PM       °AM PM       °AM PM       °AM PM       °AM PM       °Total Daily amount of Acetaminophen °Do not take more than  3,000 mg per day    ° ° °Day 5   ° °Time  °Name of Medication Number °of pills taken  °Amount of Acetaminophen  °Pain Level  ° °Comments  °AM PM       °AM PM       °AM PM       °AM PM       °AM PM       °AM   PM       °AM PM       °AM PM       °Total Daily amount of Acetaminophen °Do not take more than  3,000 mg per day    ° ° ° °Day 6   ° °Time  °Name of Medication Number of pills °taken  °Amount of Acetaminophen  °Pain Level  °Comments  °AM PM       °AM PM       °AM PM       °AM PM       °AM PM       °AM PM       °AM PM       °AM PM       °Total Daily amount of Acetaminophen °Do not take more than  3,000 mg per day    ° ° °Day 7   ° °Time  °Name of Medication Number of pills taken  °Amount of Acetaminophen  °Pain Level  ° °Comments  °AM PM       °AM PM       °AM PM       °AM PM       °AM PM       °AM PM       °AM PM       °AM PM       °Total Daily amount of Acetaminophen °Do not take more than  3,000 mg per day    ° ° ° ° °For additional information about how and where to safely dispose of unused opioid °medications - https://www.morepowerfulnc.org ° °Disclaimer: This document  contains information and/or instructional materials adapted from [facility] for the typical patient with your condition. It does not replace medical advice from your health care provider because your experience may differ from that of the °typical patient. Talk to your health care provider if you have any questions about this °document, your condition or your treatment plan. °Adapted from [facility] ° °

## 2021-09-14 NOTE — Transfer of Care (Signed)
Immediate Anesthesia Transfer of Care Note  Patient: Jeff Savage  Procedure(s) Performed: LAPAROSCOPIC CHOLECYSTECTOMY (Abdomen)  Patient Location: PACU  Anesthesia Type:General  Level of Consciousness: awake, alert  and oriented  Airway & Oxygen Therapy: Patient Spontanous Breathing and Patient connected to face mask oxygen  Post-op Assessment: Report given to RN and Post -op Vital signs reviewed and stable  Post vital signs: Reviewed and stable  Last Vitals:  Vitals Value Taken Time  BP 167/96 09/14/21 1448  Temp    Pulse 106 09/14/21 1451  Resp    SpO2 95 % 09/14/21 1451  Vitals shown include unvalidated device data.  Last Pain:  Vitals:   09/14/21 1152  TempSrc:   PainSc: 10-Worst pain ever         Complications: No notable events documented.

## 2021-09-14 NOTE — ED Notes (Signed)
Report given to [facility]. 

## 2021-09-14 NOTE — ED Notes (Signed)
Pt states he is still in 10/10 pain. Immediately falls asleep after conversation. Appears more comfortable than at arrival.

## 2021-09-14 NOTE — ED Notes (Signed)
Pt yelling in pain, unable to sit still.

## 2021-09-14 NOTE — ED Triage Notes (Signed)
C/o upper abdominal pain since Thursday, worsening today. Nausea/Vomiting today.

## 2021-09-14 NOTE — Anesthesia Preprocedure Evaluation (Addendum)
Anesthesia Evaluation  Patient identified by MRN, date of birth, ID band Patient awake    Reviewed: Allergy & Precautions, NPO status , Patient's Chart, lab work & pertinent test results  Airway Mallampati: II  TM Distance: >3 FB Neck ROM: Full    Dental no notable dental hx. (+) Teeth Intact, Dental Advisory Given   Pulmonary asthma , Current Smoker,  inhaler   Pulmonary exam normal breath sounds clear to auscultation       Cardiovascular Exercise Tolerance: Good Normal cardiovascular exam Rhythm:Regular Rate:Normal     Neuro/Psych    GI/Hepatic negative GI ROS, Neg liver ROS,   Endo/Other  negative endocrine ROS  Renal/GU negative Renal ROS     Musculoskeletal   Abdominal   Peds  Hematology Lab Results      Component                Value               Date                      WBC                      9.4                 09/14/2021                HGB                      15.7                09/14/2021                HCT                      45.2                09/14/2021                   PLT                      252                 09/14/2021              Anesthesia Other Findings   Reproductive/Obstetrics                            Anesthesia Physical Anesthesia Plan  ASA: 2 and emergent  Anesthesia Plan: General   Post-op Pain Management: Toradol IV (intra-op)*, Precedex and Dilaudid IV   Induction: Intravenous, Cricoid pressure planned and Rapid sequence  PONV Risk Score and Plan: Treatment may vary due to age or medical condition, Midazolam and Ondansetron  Airway Management Planned: Oral ETT  Additional Equipment: None  Intra-op Plan:   Post-operative Plan: Extubation in OR  Informed Consent: I have reviewed the patients History and Physical, chart, labs and discussed the procedure including the risks, benefits and alternatives for the proposed anesthesia with the  patient or authorized representative who has indicated his/her understanding and acceptance.     Dental advisory given  Plan Discussed with: CRNA and Anesthesiologist  Anesthesia Plan Comments:        Anesthesia Quick Evaluation

## 2021-09-14 NOTE — ED Notes (Signed)
Report given to short stay at [facility].

## 2021-09-14 NOTE — H&P (Signed)
Admission Note  Jeff Savage Jun 27, 1990  [id number].    Chief Complaint/Reason for Consult: RUQ pain, cholelithiasis  HPI:  This is a 31 yo black male with a history of asthma who takes a prn albuterol inhaler (last dose yesterday), who developed upper abdominal, RUQ abdominal pain 3 days ago.  He has been having nausea, vomiting, but no diarrhea, fevers, chest pain, or SOB.  He continued to have symptoms that would not resolve.  He presented to [facility] ED today where he has been found to have a gallstone lodged in the neck of his gallbladder with no significant findings for cholecystitis.  His labs are otherwise unremarkable with a normal WBC and normal LFTs.  He has been tx here to [facility] for surgical evaluation and further management.  ROS: ROS: Please see hpi  History reviewed. No pertinent family history.  Past Medical History:  Diagnosis Date   Asthma     History reviewed. No pertinent surgical history.  Social History:  reports that he has been smoking cigarettes. He has been smoking an average of .5 packs per day. He has never used smokeless tobacco. He reports current alcohol use. He reports current drug use. Drug: Marijuana.  Allergies:  Allergies  Allergen Reactions   Shellfish Allergy     Medications Prior to Admission  Medication Sig Dispense Refill   albuterol (PROVENTIL) (2.5 MG/3ML) 0.083% nebulizer solution Take 3 mLs (2.5 mg total) by nebulization every 6 (six) hours as needed for wheezing or shortness of breath. 75 mL 0   albuterol (VENTOLIN HFA) 108 (90 Base) MCG/ACT inhaler Inhale 2 puffs into the lungs every 4 (four) hours as needed for wheezing or shortness of breath. 6.7 g 0   budesonide-formoterol (SYMBICORT) 160-4.5 MCG/ACT inhaler Inhale 2 puffs into the lungs 2 (two) times daily. 1 each 1    Blood pressure (!) 134/104, pulse (!) 56, temperature 97.7 F (36.5 C), temperature source Oral, resp. rate 18, height 5\' 9"  (1.753 m), weight 96.2 kg,  SpO2 95 %. Physical Exam:  General: pleasant, WD, WN male who is laying in bed in NAD but somewhat sleepy from pain meds HEENT: head is normocephalic, atraumatic.  Sclera are noninjected.  PERRL.  Ears and nose without any masses or lesions.  Mouth is pink and moist Heart: irregular some brady down in the 40s and then to 70-90s.  Normal s1,s2. No obvious murmurs, gallops, or rubs noted.  Palpable radial and pedal pulses bilaterally Lungs: CTAB, no wheezes, rhonchi, or rales noted.  Respiratory effort nonlabored Abd: soft, tender in epigastrium and in RUQ with + Murphy's sign, ND, +BS, no masses, hernias, or organomegaly MS: all 4 extremities are symmetrical with no cyanosis, clubbing, or edema. Skin: warm and dry with no masses, lesions, or rashes Psych: A&Ox3 with an appropriate affect.   Results for orders placed or performed during the hospital encounter of 09/14/21 (from the past 48 hour(s))  Comprehensive metabolic panel     Status: Abnormal   Collection Time: 09/14/21  7:47 AM  Result Value Ref Range   Sodium 138 135 - 145 mmol/L   Potassium 3.6 3.5 - 5.1 mmol/L   Chloride 108 98 - 111 mmol/L   CO2 23 22 - 32 mmol/L   Glucose, Bld 144 (H) 70 - 99 mg/dL    Comment: Glucose reference range applies only to samples taken after fasting for at least 8 hours.   BUN 15 6 - 20 mg/dL   Creatinine, Ser  1.12 0.61 - 1.24 mg/dL   Calcium 8.8 (L) 8.9 - 10.3 mg/dL   Total Protein 7.5 6.5 - 8.1 g/dL   Albumin 3.9 3.5 - 5.0 g/dL   AST 18 15 - 41 U/L   ALT 21 0 - 44 U/L   Alkaline Phosphatase 53 38 - 126 U/L   Total Bilirubin 0.8 0.3 - 1.2 mg/dL   GFR, Estimated >62 >13 mL/min    Comment: (NOTE) Calculated using the CKD-EPI Creatinine Equation (2021)    Anion gap 7 5 - 15    Comment: Performed at [facility], [address] [place], [place] [address]  Lipase, blood     Status: None   Collection Time: 09/14/21  7:47 AM  Result Value Ref Range   Lipase 23 11 - 51 U/L     Comment: Performed at [facility], [address] [place], [place] [address]  CBC with Differential     Status: Abnormal   Collection Time: 09/14/21  7:47 AM  Result Value Ref Range   WBC 9.4 4.0 - 10.5 K/uL   RBC 5.25 4.22 - 5.81 MIL/uL   Hemoglobin 15.7 13.0 - 17.0 g/dL   HCT 29.5 28.4 - 13.2 %   MCV 86.1 80.0 - 100.0 fL   MCH 29.9 26.0 - 34.0 pg   MCHC 34.7 30.0 - 36.0 g/dL   RDW 44.0 10.2 - 72.5 %   Platelets 252 150 - 400 K/uL   nRBC 0.0 0.0 - 0.2 %   Neutrophils Relative % 39 %   Neutro Abs 3.6 1.7 - 7.7 K/uL   Lymphocytes Relative 42 %   Lymphs Abs 4.0 0.7 - 4.0 K/uL   Monocytes Relative 11 %   Monocytes Absolute 1.1 (H) 0.1 - 1.0 K/uL   Eosinophils Relative 7 %   Eosinophils Absolute 0.6 (H) 0.0 - 0.5 K/uL   Basophils Relative 1 %   Basophils Absolute 0.1 0.0 - 0.1 K/uL   Immature Granulocytes 0 %   Abs Immature Granulocytes 0.02 0.00 - 0.07 K/uL    Comment: Performed at [facility], [address] [place], [place] [address]   US Abdomen Limited RUQ (LIVER/GB)  Result Date: 09/14/2021 CLINICAL DATA:  Right upper quadrant pain for 4 days with nausea. EXAM: ULTRASOUND ABDOMEN LIMITED RIGHT UPPER QUADRANT COMPARISON:  None Available. FINDINGS: Gallbladder: Shadowing stones are seen measuring up to 0.9 cm with 1 stone impacted in the region of the gallbladder neck. There is probable layering sludge in the gallbladder. There is no gallbladder wall thickening or pericholecystic fluid. Sonographic Murphy's sign was reported positive. Common bile duct: Diameter: 3 mm Liver: No focal lesion identified. Within normal limits in parenchymal echogenicity. Portal vein is patent on color Doppler imaging with normal direction of blood flow towards the liver. Other: None. IMPRESSION: Cholelithiasis without gallbladder wall thickening or pericholecystic fluid to suggest acute cholecystitis, though sonographic Murphy's sign was reported positive. Correlate with physical  exam. HIDA scan may be obtained for further evaluation of possible acute cholecystitis if indicated. Electronically Signed   By: Lesia Hausen M.D.   On: 09/14/2021 08:53      Assessment/Plan Biliary colic likely early cholecystitis The patient has been seen and examined, labs, vitals, imaging all personally reviewed.  He appears to certainly  be very tender consistent with cholecystitis.  We will proceed with lap chole today.  I have explained the procedure, risks, and aftercare of cholecystectomy.  Risks  include but are not limited to bleeding, infection, wound problems, anesthesia, diarrhea, bile leak, injury to common bile duct/liver/intestine.  He seems to understand and agrees to proceed.  Discussed likely plan for DC home from PACU.   FEN - NPO VTE - none pre-op ID - Rocephin Admit - likely DC from PACU  Sinus arrhythmia - EKG reviewed with anesthesia.  Ok to proceed  I reviewed last 24 h vitals and pain scores, last 48 h intake and output, last 24 h labs and trends, and last 24 h imaging results.   Letha Cape, [facility] 09/14/2021, 12:09 PM Please see Amion for pager number during day hours 7:00am-4:30pm

## 2021-09-14 NOTE — Op Note (Signed)
Patient: Jeff Savage (02/19/1990, [id number])  Date of Surgery: 09/14/2021   Preoperative Diagnosis: ACUTE CHOLECYSTITIS   Postoperative Diagnosis: ACUTE CHOLECYSTITIS   Surgical Procedure: LAPAROSCOPIC CHOLECYSTECTOMY: 30160 (CPT)   Operative Team Members:  Surgeon(s) and Role:    * [place] Cawood, Hyman Hopes, MD - Primary   Anesthesiologist: Trevor Iha, MD; Lowella Curb, MD CRNA: Elisabeth Cara, CRNA; Williford, Peggy D, CRNA   Anesthesia: General   Fluids:  Total I/O In: 1612 [I.V.:1011.9; IV Piggyback:600.1] Out: -   Complications: None  Drains:  none   Specimen:  ID Type Source Tests Collected by Time Destination  1 : GALLBLADDER Tissue PATH Gallbladder SURGICAL PATHOLOGY Danne Scardina, Hyman Hopes, MD 09/14/2021 1226      Disposition:  PACU - hemodynamically stable.  Plan of Care: Discharge to home after PACU    Indications for Procedure: Jeff Savage is a 31 y.o. male who presented with abdominal pain.  History, physical and imaging was concerning for cholecystitis.  Laparoscopic cholecystectomy was recommended for the patient.  The procedure itself, as well as the risks, benefits and alternatives were discussed with the patient.  Risks discussed included but were not limited to the risk of infection, bleeding, damage to nearby structures, need to convert to open procedure, incisional hernia, bile leak, common bile duct injury and the need for additional procedures or surgeries.  With this discussion complete and all questions answered the patient granted consent to proceed.  Findings: Gangrenous gallbladder with gallstones  Infection status: Patient: Jeff Savage Emergency General Surgery Service Patient Case: Urgent Infection Present At Time Of Surgery (PATOS):  Some spillage of bile   Description of Procedure:   On the date stated above, the patient was taken to the operating room suite and placed in supine positioning.  Sequential compression  devices were placed on the lower extremities to prevent blood clots.  General endotracheal anesthesia was induced. Preoperative antibiotics were given.  The patient's abdomen was prepped and draped in the usual sterile fashion.  A time-out was completed verifying the correct patient, procedure, positioning and equipment needed for the case.  We began by anesthetizing the skin with local anesthetic and then making a 5 mm incision just below the umbilicus.  We dissected through the subcutaneous tissues to the fascia.  The fascia was grasped and elevated using a Kocher clamp.  A Veress needle was inserted into the abdomen and the abdomen was insufflated to 15 mmHg.  A 5 mm trocar was inserted in this position under optical guidance and then the abdomen was inspected.  There was no trauma to the underlying viscera with initial trocar placement.  Any abnormal findings, other than inflammation in the right upper quadrant, are listed above in the findings section.  Three additional trocars were placed, one 12 mm trocar in the subxiphoid position, one 5 mm trocar in the midline epigastric area and one 63mm trocar in the right upper quadrant subcostally.  These were placed under direct vision without any trauma to the underlying viscera.    The patient was then placed in head up, left side down positioning.  The gallbladder was identified and dissected free from its attachments to the omentum allowing the duodenum to fall away.  The infundibulum of the gallbladder was dissected free working laterally to medially.  The cystic duct and cystic artery were dissected free from surrounding connective tissue.  The infundibulum of the gallbladder was dissected off the cystic plate.  A critical view of safety was obtained  with the cystic duct and cystic artery being cleared of connective tissues and clearly the only two structures entering into the gallbladder with the liver clearly visible behind.  Clips were then applied to the  cystic duct and cystic artery and then these structures were divided.  The gallbladder was dissected off the cystic plate, placed in an endocatch bag and removed from the 12 mm subxiphoid port site.  The clips were inspected and appeared effective.  The cystic plate was inspected and hemostasis was obtained using electrocautery.  A suction irrigator was used to clean the operative field.  Attention was turned to closure.  The 12 mm subxiphoid port site was closed using a 0-vicryl suture on a fascial suture passer.  The abdomen was desufflated.  The skin was closed using 4-0 monocryl and dermabond.  All sponge and needle counts were correct at the conclusion of the case.    Jeff Drape, MD General, Bariatric, & Minimally Invasive Surgery [facility], [place]

## 2021-09-14 NOTE — ED Notes (Signed)
US at bedside

## 2021-09-14 NOTE — ED Provider Notes (Signed)
Emergency Department Provider Note   I have reviewed the triage vital signs and the nursing notes.   HISTORY  Chief Complaint Abdominal Pain   HPI Jeff Savage is a 31 y.o. male with PMH of asthma presents to the emergency department for evaluation of epigastric abdominal pain which is occurred intermittently since Thursday but starting this morning has been severe.  He is having pain mainly in the epigastric and right upper quadrant region along with nausea and vomiting.  No prior abdominal surgical history.  No fevers or chills.  He has been vomiting nonbloody emesis without diarrhea.  No similar pain in the past.  He does use marijuana but denies significant alcohol use.   Past Medical History:  Diagnosis Date   Asthma     Review of Systems  Constitutional: No fever/chills Eyes: No visual changes. ENT: No sore throat. Cardiovascular: Denies chest pain. Respiratory: Denies shortness of breath. Gastrointestinal: Positive epigastric abdominal pain.  Positive nausea and vomiting.  No diarrhea.  No constipation. Genitourinary: Negative for dysuria. Musculoskeletal: Negative for back pain. Skin: Negative for rash. Neurological: Negative for headaches, focal weakness or numbness.   ____________________________________________   PHYSICAL EXAM:  VITAL SIGNS: ED Triage Vitals  Enc Vitals Group     BP 09/14/21 0737 (!) 158/110     Pulse Rate 09/14/21 0737 63     Resp 09/14/21 0740 (!) 28     Temp --      Temp src --      SpO2 09/14/21 0737 100 %     Weight 09/14/21 0738 212 lb (96.2 kg)     Height 09/14/21 0738 5\' 9"  (1.753 m)   Constitutional: Alert and oriented. Patient grimacing, holding his abdomen, and appearing uncomfortable with moaning. Eyes: Conjunctivae are normal.  Head: Atraumatic. Nose: No congestion/rhinnorhea. Mouth/Throat: Mucous membranes are moist. Neck: No stridor.   Cardiovascular: Normal rate, regular rhythm. Good peripheral circulation.  Grossly normal heart sounds.   Respiratory: Normal respiratory effort.  No retractions. Lungs CTAB. Gastrointestinal: Soft with focal tenderness in the RUQ with focal tenderness. No tenderness in remaining quadrants. No distention.  Musculoskeletal: No lower extremity tenderness nor edema. No gross deformities of extremities. Neurologic:  Normal speech and language. No gross focal neurologic deficits are appreciated.  Skin:  Skin is warm, dry and intact. No rash noted.  ____________________________________________   LABS (all labs ordered are listed, but only abnormal results are displayed)  Labs Reviewed  COMPREHENSIVE METABOLIC PANEL - Abnormal; Notable for the following components:      Result Value   Glucose, Bld 144 (*)    Calcium 8.8 (*)    All other components within normal limits  CBC WITH DIFFERENTIAL/PLATELET - Abnormal; Notable for the following components:   Monocytes Absolute 1.1 (*)    Eosinophils Absolute 0.6 (*)    All other components within normal limits  LIPASE, BLOOD   ____________________________________________  RADIOLOGY  Abdomen Limited RUQ (LIVER/GB)  Result Date: 09/14/2021 CLINICAL DATA:  Right upper quadrant pain for 4 days with nausea. EXAM: ULTRASOUND ABDOMEN LIMITED RIGHT UPPER QUADRANT COMPARISON:  None Available. FINDINGS: Gallbladder: Shadowing stones are seen measuring up to 0.9 cm with 1 stone impacted in the region of the gallbladder neck. There is probable layering sludge in the gallbladder. There is no gallbladder wall thickening or pericholecystic fluid. Sonographic Murphy's sign was reported positive. Common bile duct: Diameter: 3 mm Liver: No focal lesion identified. Within normal limits in parenchymal echogenicity. Portal vein is patent  on color Doppler imaging with normal direction of blood flow towards the liver. Other: None. IMPRESSION: Cholelithiasis without gallbladder wall thickening or pericholecystic fluid to suggest acute  cholecystitis, though sonographic Murphy's sign was reported positive. Correlate with physical exam. HIDA scan may be obtained for further evaluation of possible acute cholecystitis if indicated. Electronically Signed   By: Lesia Hausen M.D.   On: 09/14/2021 08:53    ____________________________________________   EKG Interpretation  Date/Time:  Monday September 14 2021 08:00:20 EDT Ventricular Rate:  46 PR Interval:  109 QRS Duration: 103 QT Interval:  484 QTC Calculation: 400 R Axis:   53 Text Interpretation: Sinus bradycardia Atrial premature complex Short PR interval Nonspecific T abnormalities, inferior leads Borderline ST elevation, anterior leads No prior tracings for comparison Confirmed by Alona Bene [phone]) on 09/14/2021 8:03:34 AM       ____________________________________________   PROCEDURES  Procedure(s) performed:   Procedures  None  ____________________________________________   INITIAL IMPRESSION / ASSESSMENT AND PLAN / ED COURSE  Pertinent labs & imaging results that were available during my care of the patient were reviewed by me and considered in my medical decision making (see chart for details).   This patient is Presenting for Evaluation of epigastric, which does require a range of treatment options, and is a complaint that involves a high risk of morbidity and mortality.  The Differential Diagnoses includes but is not exclusive to acute cholecystitis, intrathoracic causes for epigastric abdominal pain, gastritis, duodenitis, pancreatitis, small bowel or large bowel obstruction, abdominal aortic aneurysm, hernia, gastritis, etc.   Critical Interventions-    Medications  lactated ringers infusion ( Intravenous Restarted 09/14/21 1046)  0.9 %  sodium chloride infusion ( Intravenous MAR Hold 09/14/21 1134)  lactated ringers infusion ( Intravenous Continued from Pre-op 09/14/21 1219)  morphine (PF) 4 MG/ML injection 4 mg (4 mg Intravenous Given 09/14/21 0754)   ondansetron (ZOFRAN) injection 4 mg (4 mg Intravenous Given 09/14/21 0751)  sodium chloride 0.9 % bolus 500 mL ( Intravenous Stopped 09/14/21 0903)  HYDROmorphone (DILAUDID) injection 1 mg (1 mg Intravenous Given 09/14/21 0843)  metoCLOPramide (REGLAN) injection 10 mg (10 mg Intravenous Given 09/14/21 0844)  pantoprazole (PROTONIX) injection 40 mg (40 mg Intravenous Given 09/14/21 0844)  cefTRIAXone (ROCEPHIN) 2 g in sodium chloride 0.9 % 100 mL IVPB (0 g Intravenous Stopped 09/14/21 1031)  chlorhexidine (PERIDEX) 0.12 % solution 15 mL (15 mLs Mouth/Throat Given 09/14/21 1156)    Or  Oral care mouth rinse ( Mouth Rinse See Alternative 09/14/21 1156)    Reassessment after intervention: Symptoms slightly improved but tenderness remains and patient subjectively remains uncomfortable.   I did obtain Additional Historical Information from SO at bedside.  I decided to review pertinent External Data, and in summary patient with multiple ED visits for asthma exacerbations but no recent visits for abdominal pain.   Clinical Laboratory Tests Ordered, included no leukocytosis.  LFTs and bilirubin normal.  Lipase normal.  Radiologic Tests Ordered, included RUQ [place]. I independently interpreted the images and agree with radiology interpretation.   Cardiac Monitor Tracing which shows NSR.   Social Determinants of Health Risk no regular EtOH use but does use marijuana.  Consult complete with General Surgery at [facility].  Discussed case and ultrasound findings.  Plan to transfer the patient to preop at [facility] and they can evaluate him there, likely for cholecystectomy today.    Medical Decision Making: Summary:  Patient presents emergency department with epigastric abdominal pain along with nausea and  vomiting.  He has tenderness mainly in the right upper quadrant raising suspicion for gallbladder etiology for his pain.  We will start with right upper quadrant ultrasound along with labs and pain medication.   Patient does appear fairly uncomfortable.  Considered alternate etiologies for pain such as vascular etiology/dissection but low risk for this clinically.  Will follow after pain medication reassess.  Reevaluation with update and discussion with patient.  He is in agreement with plan for transfer to the [facility] preop area for surgery evaluation and consideration of cholecystectomy.   Disposition: admit  ____________________________________________  FINAL CLINICAL IMPRESSION(S) / ED DIAGNOSES  Final diagnoses:  Symptomatic cholelithiasis    Note:  This document was prepared using Dragon voice recognition software and may include unintentional dictation errors.  Alona Bene, MD, [facility]    Kemarion Abbey, Arlyss Repress, MD 09/14/21 [phone]

## 2021-09-15 ENCOUNTER — Other Ambulatory Visit: Payer: Self-pay

## 2021-09-15 ENCOUNTER — Encounter (HOSPITAL_COMMUNITY): Payer: Self-pay | Admitting: Surgery

## 2021-09-15 NOTE — Anesthesia Postprocedure Evaluation (Signed)
Anesthesia Post Note  Patient: Jeff Savage  Procedure(s) Performed: LAPAROSCOPIC CHOLECYSTECTOMY (Abdomen)     Patient location during evaluation: PACU Anesthesia Type: General Level of consciousness: awake and alert Pain management: pain level controlled Vital Signs Assessment: post-procedure vital signs reviewed and stable Respiratory status: spontaneous breathing, nonlabored ventilation and respiratory function stable Cardiovascular status: blood pressure returned to baseline and stable Postop Assessment: no apparent nausea or vomiting Anesthetic complications: no   No notable events documented.  Last Vitals:  Vitals:   09/14/21 1600 09/14/21 1652  BP: (!) 144/99 (!) 142/72  Pulse: 97 94  Resp: 20 17  Temp: 36.7 C 36.7 C  SpO2: 92% 94%    Last Pain:  Vitals:   09/14/21 1652  TempSrc: Oral  PainSc: 3    Pain Goal: Patients Stated Pain Goal: 3 (09/14/21 1600)                 Lowella Curb

## 2021-09-16 LAB — SURGICAL PATHOLOGY

## 2021-12-01 ENCOUNTER — Encounter (HOSPITAL_BASED_OUTPATIENT_CLINIC_OR_DEPARTMENT_OTHER): Payer: Self-pay | Admitting: Emergency Medicine

## 2021-12-01 ENCOUNTER — Other Ambulatory Visit: Payer: Self-pay

## 2021-12-01 ENCOUNTER — Emergency Department (HOSPITAL_BASED_OUTPATIENT_CLINIC_OR_DEPARTMENT_OTHER)
Admission: EM | Admit: 2021-12-01 | Discharge: 2021-12-02 | Disposition: A | Discharge status: Home / Self Care | Payer: Self-pay | Attending: Emergency Medicine | Admitting: Emergency Medicine

## 2021-12-01 DIAGNOSIS — H1032 Unspecified acute conjunctivitis, left eye: Secondary | ICD-10-CM | POA: Insufficient documentation

## 2021-12-01 MED ORDER — FLUORESCEIN SODIUM 1 MG OP STRP
ORAL_STRIP | OPHTHALMIC | Status: AC
  Administered 2021-12-01: 1 via OPHTHALMIC
  Filled 2021-12-01: qty 1

## 2021-12-01 MED ORDER — ERYTHROMYCIN 5 MG/GM OP OINT
TOPICAL_OINTMENT | Freq: Once | OPHTHALMIC | Status: AC
  Administered 2021-12-01: 1 via OPHTHALMIC
  Filled 2021-12-01: qty 3.5

## 2021-12-01 NOTE — ED Triage Notes (Signed)
Pt reports he thinks he has pink eye in left eye. Endorses redness, pain when blinking, and swelling. Denies changes in vision.

## 2021-12-01 NOTE — ED Provider Notes (Incomplete)
  [facility] EMERGENCY DEPARTMENT Provider Note   CSN: [id number] Arrival date & time: 12/01/21  1816     History {Add pertinent medical, surgical, social history, OB history to HPI:1} Chief Complaint  Patient presents with  . Eye Problem    LEFT    Jeff Savage is a 31 y.o. male.  31 year old male with complaint of left eye irritation and redness, onset two days ago. No known eye trauma. Does not wear contact lenses.  The history is provided by the patient. No language interpreter was used.  Eye Problem Location:  Left eye Quality:  Foreign body sensation and tearing Severity:  Moderate Onset quality:  Sudden Duration:  2 days Timing:  Constant Chronicity:  New Context: not contact lens problem and not direct trauma   Associated symptoms: discharge, inflammation, redness and tearing   Associated symptoms: no blurred vision, no decreased vision and no photophobia        Home Medications Prior to Admission medications   Medication Sig Start Date End Date Taking? Authorizing Provider  albuterol (PROVENTIL) (2.5 MG/3ML) 0.083% nebulizer solution Take 3 mLs (2.5 mg total) by nebulization every 6 (six) hours as needed for wheezing or shortness of breath. 07/23/20   Isla Pence, MD  albuterol (VENTOLIN HFA) 108 (90 Base) MCG/ACT inhaler Inhale 2 puffs into the lungs every 4 (four) hours as needed for wheezing or shortness of breath. 06/02/21   Curatolo, Adam, DO  budesonide-formoterol (SYMBICORT) 160-4.5 MCG/ACT inhaler Inhale 2 puffs into the lungs 2 (two) times daily. 06/29/21   Molpus, John, MD  oxyCODONE (OXY IR/ROXICODONE) 5 MG immediate release tablet Take 1 tablet (5 mg total) by mouth every 4 (four) hours as needed. 09/14/21   Saverio Danker, PA-C      Allergies    Shellfish allergy    Review of Systems   Review of Systems  Eyes:  Positive for discharge and redness. Negative for blurred vision, photophobia and visual disturbance.    Physical  Exam Updated Vital Signs BP (!) 144/94 (BP Location: Right Arm)   Pulse 96   Temp 98.6 F (37 C) (Oral)   Resp 18   Ht 5\' 9"  (1.753 m)   Wt 113.4 kg   SpO2 97%   BMI 36.92 kg/m  Physical Exam  ED Results / Procedures / Treatments   Labs (all labs ordered are listed, but only abnormal results are displayed) Labs Reviewed - No data to display  EKG None  Radiology No results found.  Procedures Procedures  {Document cardiac monitor, telemetry assessment procedure when appropriate:1}  Medications Ordered in ED Medications  fluorescein 1 MG ophthalmic strip (has no administration in time range)    ED Course/ Medical Decision Making/ A&P                           Medical Decision Making  ***  {Document critical care time when appropriate:1} {Document review of labs and clinical decision tools ie heart score, Chads2Vasc2 etc:1}  {Document your independent review of radiology images, and any outside records:1} {Document your discussion with family members, caretakers, and with consultants:1} {Document social determinants of health affecting pt's care:1} {Document your decision making why or why not admission, treatments were needed:1} Final Clinical Impression(s) / ED Diagnoses Final diagnoses:  None    Rx / DC Orders ED Discharge Orders     None

## 2021-12-01 NOTE — Discharge Instructions (Signed)
Use eye ointment as directed. Follow-up with ophthalmology as discussed.

## 2021-12-01 NOTE — ED Provider Notes (Signed)
[facility] EMERGENCY DEPARTMENT Provider Note   CSN: [id number] Arrival date & time: 12/01/21  1816     History  Chief Complaint  Patient presents with   Eye Problem    LEFT    Jeff Savage is a 31 y.o. male.  31 year old male with complaint of left eye irritation and redness, onset two days ago. No known eye trauma. Does not wear contact lenses.  The history is provided by the patient. No language interpreter was used.  Eye Problem Location:  Left eye Quality:  Foreign body sensation and tearing Severity:  Moderate Onset quality:  Sudden Duration:  2 days Timing:  Constant Chronicity:  New Context: not contact lens problem and not direct trauma   Associated symptoms: discharge, inflammation, redness and tearing   Associated symptoms: no blurred vision, no decreased vision and no photophobia        Home Medications Prior to Admission medications   Medication Sig Start Date End Date Taking? Authorizing Provider  albuterol (PROVENTIL) (2.5 MG/3ML) 0.083% nebulizer solution Take 3 mLs (2.5 mg total) by nebulization every 6 (six) hours as needed for wheezing or shortness of breath. 07/23/20   Isla Pence, MD  albuterol (VENTOLIN HFA) 108 (90 Base) MCG/ACT inhaler Inhale 2 puffs into the lungs every 4 (four) hours as needed for wheezing or shortness of breath. 06/02/21   Curatolo, Adam, DO  budesonide-formoterol (SYMBICORT) 160-4.5 MCG/ACT inhaler Inhale 2 puffs into the lungs 2 (two) times daily. 06/29/21   Molpus, John, MD  oxyCODONE (OXY IR/ROXICODONE) 5 MG immediate release tablet Take 1 tablet (5 mg total) by mouth every 4 (four) hours as needed. 09/14/21   Saverio Danker, PA-C      Allergies    Shellfish allergy    Review of Systems   Review of Systems  Eyes:  Positive for discharge and redness. Negative for blurred vision, photophobia and visual disturbance.    Physical Exam Updated Vital Signs BP (!) 144/94 (BP Location: Right Arm)   Pulse 96    Temp 98.6 F (37 C) (Oral)   Resp 18   Ht 5\' 9"  (1.753 m)   Wt 113.4 kg   SpO2 97%   BMI 36.92 kg/m  Physical Exam Vitals and nursing note reviewed.  Constitutional:      Appearance: Normal appearance.  Eyes:     General: Lids are everted, no foreign bodies appreciated.        Left eye: Discharge present.No foreign body.     Extraocular Movements:     Left eye: Normal extraocular motion and no nystagmus.     Conjunctiva/sclera:     Left eye: Left conjunctiva is not injected.     Pupils:     Left eye: No corneal abrasion or fluorescein uptake.  Cardiovascular:     Rate and Rhythm: Normal rate.  Pulmonary:     Effort: Pulmonary effort is normal.  Musculoskeletal:        General: Normal range of motion.     Cervical back: Neck supple.  Skin:    General: Skin is warm and dry.  Neurological:     Mental Status: He is alert and oriented to person, place, and time.  Psychiatric:        Mood and Affect: Mood normal.        Behavior: Behavior normal.     ED Results / Procedures / Treatments   Labs (all labs ordered are listed, but only abnormal results are displayed) Labs  Reviewed - No data to display  EKG None  Radiology No results found.  Procedures Procedures    Medications Ordered in ED Medications  fluorescein 1 MG ophthalmic strip (1 strip Left Eye Given by Other 12/01/21 2325)  erythromycin ophthalmic ointment (1 Application Left Eye Given 12/01/21 2351)    ED Course/ Medical Decision Making/ A&P                           Medical Decision Making Risk Prescription drug management.   Patient presentation consistent with conjunctivitis.  No evidence of corneal abrasions, entrapment, consensual photophobia, or herpes keratitis.  Presentation not concerning for iritis, or corneal abrasions.  Pt discharged with erythromycin.  Personal hygiene and frequent handwashing discussed.  Patient advised to follow up with ophthalmologist if symptoms persist or  worsen. Return precautions discussed.  Patient verbalizes understanding and is agreeable with discharge.         Final Clinical Impression(s) / ED Diagnoses Final diagnoses:  Acute conjunctivitis of left eye, unspecified acute conjunctivitis type    Rx / DC Orders ED Discharge Orders     None         Felicie Morn, NP 12/02/21 0106    Vanetta Mulders, MD 12/03/21 0000

## 2021-12-02 NOTE — ED Notes (Signed)
Did not want discharge vitals. 

## 2022-01-18 ENCOUNTER — Other Ambulatory Visit: Payer: Self-pay

## 2022-01-18 ENCOUNTER — Encounter (HOSPITAL_BASED_OUTPATIENT_CLINIC_OR_DEPARTMENT_OTHER): Payer: Self-pay | Admitting: Emergency Medicine

## 2022-01-18 ENCOUNTER — Emergency Department (HOSPITAL_BASED_OUTPATIENT_CLINIC_OR_DEPARTMENT_OTHER): Payer: Self-pay

## 2022-01-18 ENCOUNTER — Emergency Department (HOSPITAL_BASED_OUTPATIENT_CLINIC_OR_DEPARTMENT_OTHER)
Admission: EM | Admit: 2022-01-18 | Discharge: 2022-01-18 | Disposition: A | Discharge status: Home / Self Care | Payer: Self-pay | Attending: Emergency Medicine | Admitting: Emergency Medicine

## 2022-01-18 DIAGNOSIS — J4521 Mild intermittent asthma with (acute) exacerbation: Secondary | ICD-10-CM | POA: Insufficient documentation

## 2022-01-18 DIAGNOSIS — Z20822 Contact with and (suspected) exposure to covid-19: Secondary | ICD-10-CM | POA: Insufficient documentation

## 2022-01-18 LAB — RESP PANEL BY RT-PCR (FLU A&B, COVID) ARPGX2
Influenza A by PCR: NEGATIVE
Influenza B by PCR: NEGATIVE
SARS Coronavirus 2 by RT PCR: NEGATIVE

## 2022-01-18 MED ORDER — ALBUTEROL SULFATE HFA 108 (90 BASE) MCG/ACT IN AERS
1.0000 | INHALATION_SPRAY | Freq: Once | RESPIRATORY_TRACT | Status: AC
  Administered 2022-01-18: 2 via RESPIRATORY_TRACT
  Filled 2022-01-18: qty 6.7

## 2022-01-18 MED ORDER — ALBUTEROL SULFATE HFA 108 (90 BASE) MCG/ACT IN AERS: 1.0000 | INHALATION_SPRAY | Freq: Four times a day (QID) | RESPIRATORY_TRACT | 0 refills | Status: AC | PRN

## 2022-01-18 MED ORDER — ALBUTEROL SULFATE (2.5 MG/3ML) 0.083% IN NEBU
2.5000 mg | INHALATION_SOLUTION | RESPIRATORY_TRACT | Status: DC | PRN
  Administered 2022-01-18 (×3): 2.5 mg via RESPIRATORY_TRACT
  Filled 2022-01-18: qty 3

## 2022-01-18 MED ORDER — DEXAMETHASONE SODIUM PHOSPHATE 10 MG/ML IJ SOLN
10.0000 mg | Freq: Once | INTRAMUSCULAR | Status: DC
  Filled 2022-01-18: qty 1

## 2022-01-18 MED ORDER — PREDNISONE 10 MG PO TABS: 40.0000 mg | ORAL_TABLET | Freq: Every day | ORAL | 0 refills | Status: AC

## 2022-01-18 MED ORDER — DEXAMETHASONE SODIUM PHOSPHATE 10 MG/ML IJ SOLN
10.0000 mg | Freq: Once | INTRAMUSCULAR | Status: AC
  Administered 2022-01-18: 10 mg via INTRAMUSCULAR

## 2022-01-18 NOTE — ED Provider Notes (Signed)
[facility] EMERGENCY DEPARTMENT Provider Note   CSN: [id number] Arrival date & time: 01/18/22  1200     History  Chief Complaint  Patient presents with   Shortness of Breath         Jeff Savage is a 31 y.o. male.  HPI 31 year old male presents to the ER with complaints of shortness of breath, wheezing.  History of asthma.  Was recently incarcerated and felt like he started having exacerbation, he was just started on steroids (received 2 doses), and is receiving an albuterol inhaler while in prison but was not able to take these medications home.  He continues to have shortness of breath and wheezing.  He does not have a PCP.  Denies any chest pain.    Home Medications Prior to Admission medications   Medication Sig Start Date End Date Taking? Authorizing Provider  albuterol (VENTOLIN HFA) 108 (90 Base) MCG/ACT inhaler Inhale 1-2 puffs into the lungs every 6 (six) hours as needed for wheezing or shortness of breath. 01/18/22  Yes Garald Balding, PA-C  predniSONE (DELTASONE) 10 MG tablet Take 4 tablets (40 mg total) by mouth daily for 5 days. 01/18/22 01/23/22 Yes Garald Balding, PA-C  budesonide-formoterol (SYMBICORT) 160-4.5 MCG/ACT inhaler Inhale 2 puffs into the lungs 2 (two) times daily. 06/29/21   Molpus, John, MD  oxyCODONE (OXY IR/ROXICODONE) 5 MG immediate release tablet Take 1 tablet (5 mg total) by mouth every 4 (four) hours as needed. 09/14/21   Saverio Danker, PA-C      Allergies    Shellfish allergy    Review of Systems   Review of Systems  Physical Exam Updated Vital Signs BP (!) 142/97 (BP Location: Left Arm)   Pulse (!) 106   Temp 98.2 F (36.8 C) (Oral)   Resp 15   Ht 5\' 9"  (1.753 m)   Wt 95.3 kg   SpO2 98%   BMI 31.01 kg/m  Physical Exam Vitals and nursing note reviewed.  Constitutional:      General: He is not in acute distress.    Appearance: He is well-developed.  HENT:     Head: Normocephalic and atraumatic.  Eyes:      Conjunctiva/sclera: Conjunctivae normal.  Cardiovascular:     Rate and Rhythm: Normal rate and regular rhythm.     Heart sounds: No murmur heard. Pulmonary:     Effort: Pulmonary effort is normal. No respiratory distress.     Breath sounds: Wheezing present.     Comments: Inspiratory and expiratory wheeze heard in all 4 lobes.  Speaking in full sentences, no audible wheezing Chest:     Chest wall: No tenderness.  Abdominal:     Palpations: Abdomen is soft.     Tenderness: There is no abdominal tenderness.  Musculoskeletal:        General: No swelling.     Cervical back: Neck supple.     Right lower leg: No edema.     Left lower leg: No edema.  Skin:    General: Skin is warm and dry.     Capillary Refill: Capillary refill takes less than 2 seconds.  Neurological:     Mental Status: He is alert.  Psychiatric:        Mood and Affect: Mood normal.     ED Results / Procedures / Treatments   Labs (all labs ordered are listed, but only abnormal results are displayed) Labs Reviewed  RESP PANEL BY RT-PCR (FLU A&B, COVID) [id number]  EKG EKG Interpretation  Date/Time:  Monday January 18 2022 12:49:15 EST Ventricular Rate:  100 PR Interval:  170 QRS Duration: 78 QT Interval:  330 QTC Calculation: 425 R Axis:   37 Text Interpretation: Normal sinus rhythm Nonspecific T wave abnormality Abnormal ECG When compared with ECG of 14-Sep-2021 12:50, PREVIOUS ECG IS PRESENT Confirmed by Ernie Avena ([id number]) on 01/18/2022 5:07:43 PM  Radiology DG Chest 2 View  Result Date: 01/18/2022 CLINICAL DATA:  Shortness of breath and chest tightness. EXAM: CHEST - 2 VIEW COMPARISON:  07/23/2020 FINDINGS: Heart size and mediastinal contours appear normal. No pleural effusion or edema. No airspace opacities identified. Chronic bronchitic changes with central airway thickening appears similar to previous exam. IMPRESSION: 1. No acute cardiopulmonary abnormalities. 2. Chronic bronchitic changes.  Electronically Signed   By: Signa Kell M.D.   On: 01/18/2022 12:59    Procedures Procedures    Medications Ordered in ED Medications  albuterol (PROVENTIL) (2.5 MG/3ML) 0.083% nebulizer solution 2.5 mg (2.5 mg Nebulization Given 01/18/22 1755)  dexamethasone (DECADRON) injection 10 mg (10 mg Intramuscular Given 01/18/22 1739)  albuterol (VENTOLIN HFA) 108 (90 Base) MCG/ACT inhaler 1-2 puff (2 puffs Inhalation Given 01/18/22 1757)    ED Course/ Medical Decision Making/ A&P                           Medical Decision Making Amount and/or Complexity of Data Reviewed Radiology: ordered.  Risk Prescription drug management.   31 year old male presents to the ER with complaints of asthma exacerbation.  States that he started to have an exacerbation about a week ago, got initial treatment in prison but was not able to take any of the medications at home.  He is here requesting albuterol inhaler and a steroid taper.  He does have inspiratory notes but story wheezes heard, chest x-ray ordered, reviewed, agree with radiology read, negative for pneumonia.  COVID and flu test reviewed, negative for infection.  No evidence of respiratory distress or hypoxia.  He received a albuterol inhaler, will send home with prescription and steroid course.  Considered admission but no indication given O2 sats, no respiratory distress.  Recommended PCP follow-up, patient is uninsured, will refer to [facility] and wellness.  Final Clinical Impression(s) / ED Diagnoses Final diagnoses:  Mild intermittent asthma with exacerbation    Rx / DC Orders ED Discharge Orders          Ordered    predniSONE (DELTASONE) 10 MG tablet  Daily        01/18/22 1733    albuterol (VENTOLIN HFA) 108 (90 Base) MCG/ACT inhaler  Every 6 hours PRN        01/18/22 1736              Leone Brand 01/18/22 1806    Ernie Avena, MD 01/18/22 Windell Moment

## 2022-01-18 NOTE — Discharge Instructions (Signed)
You were evaluated in the Emergency Department and after careful evaluation, we did not find any emergent condition requiring admission or further testing in the hospital.  Take your medications as directed until finished.  You can follow-up with [facility] which is a free clinic in the area.  Please return to the Emergency Department if you experience any worsening of your condition.  We encourage you to follow up with a primary care provider.  Thank you for allowing [place] to be a part of your care.

## 2022-01-18 NOTE — ED Notes (Signed)
Pt provided discharge instructions and prescription information. Pt was given the opportunity to ask questions and questions were answered.   

## 2022-01-18 NOTE — ED Triage Notes (Signed)
Patient presents POV reporting SOB and chest tightness. States he has been sick with cold like S/S. Had been in jail up until this past Friday and was receiving breathing tx, given inhaler, and dose of prednisone while there. When he got out he was not able to take any medications with him.

## 2022-02-06 ENCOUNTER — Other Ambulatory Visit: Payer: Self-pay

## 2022-02-06 ENCOUNTER — Emergency Department (HOSPITAL_COMMUNITY)
Admission: EM | Admit: 2022-02-06 | Discharge: 2022-02-06 | Disposition: A | Discharge status: Home / Self Care | Payer: Self-pay | Attending: Emergency Medicine | Admitting: Emergency Medicine

## 2022-02-06 ENCOUNTER — Emergency Department (HOSPITAL_COMMUNITY): Payer: Self-pay

## 2022-02-06 DIAGNOSIS — Z7951 Long term (current) use of inhaled steroids: Secondary | ICD-10-CM | POA: Insufficient documentation

## 2022-02-06 DIAGNOSIS — J45901 Unspecified asthma with (acute) exacerbation: Secondary | ICD-10-CM | POA: Insufficient documentation

## 2022-02-06 DIAGNOSIS — Z1152 Encounter for screening for COVID-19: Secondary | ICD-10-CM | POA: Insufficient documentation

## 2022-02-06 DIAGNOSIS — R030 Elevated blood-pressure reading, without diagnosis of hypertension: Secondary | ICD-10-CM | POA: Insufficient documentation

## 2022-02-06 LAB — BASIC METABOLIC PANEL
Anion gap: 7 (ref 5–15)
BUN: 15 mg/dL (ref 6–20)
CO2: 27 mmol/L (ref 22–32)
Calcium: 9.4 mg/dL (ref 8.9–10.3)
Chloride: 106 mmol/L (ref 98–111)
Creatinine, Ser: 0.91 mg/dL (ref 0.61–1.24)
GFR, Estimated: >60 mL/min (ref >60)
Glucose, Bld: 92 mg/dL (ref 70–99)
Potassium: 3.7 mmol/L (ref 3.5–5.1)
Sodium: 140 mmol/L (ref 135–145)

## 2022-02-06 LAB — RESP PANEL BY RT-PCR (RSV, FLU A&B, COVID)  RVPGX2
Influenza A by PCR: NEGATIVE
Influenza B by PCR: NEGATIVE
Resp Syncytial Virus by PCR: NEGATIVE
SARS Coronavirus 2 by RT PCR: NEGATIVE

## 2022-02-06 LAB — CBC WITH DIFFERENTIAL/PLATELET
Abs Immature Granulocytes: 0.02 10*3/uL (ref 0.00–0.07)
Basophils Absolute: 0.1 10*3/uL (ref 0.0–0.1)
Basophils Relative: 1 %
Eosinophils Absolute: 0.5 10*3/uL (ref 0.0–0.5)
Eosinophils Relative: 5 %
HCT: 48.4 % (ref 39.0–52.0)
Hemoglobin: 16 g/dL (ref 13.0–17.0)
Immature Granulocytes: 0 %
Lymphocytes Relative: 24 %
Lymphs Abs: 2.6 10*3/uL (ref 0.7–4.0)
MCH: 29.7 pg (ref 26.0–34.0)
MCHC: 33.1 g/dL (ref 30.0–36.0)
MCV: 90 fL (ref 80.0–100.0)
Monocytes Absolute: 1 10*3/uL (ref 0.1–1.0)
Monocytes Relative: 9 %
Neutro Abs: 6.7 10*3/uL (ref 1.7–7.7)
Neutrophils Relative %: 61 %
Platelets: 224 10*3/uL (ref 150–400)
RBC: 5.38 MIL/uL (ref 4.22–5.81)
RDW: 13.9 % (ref 11.5–15.5)
WBC: 10.9 10*3/uL — ABNORMAL HIGH (ref 4.0–10.5)
nRBC: 0 % (ref 0.0–0.2)

## 2022-02-06 MED ORDER — PREDNISONE 20 MG PO TABS: 60.0000 mg | ORAL_TABLET | Freq: Every day | ORAL | 0 refills | Status: AC

## 2022-02-06 MED ORDER — IPRATROPIUM-ALBUTEROL 0.5-2.5 (3) MG/3ML IN SOLN
3.0000 mL | Freq: Once | RESPIRATORY_TRACT | Status: AC
  Administered 2022-02-06: 3 mL via RESPIRATORY_TRACT
  Filled 2022-02-06: qty 3

## 2022-02-06 MED ORDER — MAGNESIUM SULFATE 2 GM/50ML IV SOLN
2.0000 g | Freq: Once | INTRAVENOUS | Status: AC
Start: 2022-02-06 — End: 2022-02-06
  Administered 2022-02-06: 2 g via INTRAVENOUS
  Filled 2022-02-06: qty 50

## 2022-02-06 MED ORDER — ALBUTEROL SULFATE HFA 108 (90 BASE) MCG/ACT IN AERS
4.0000 | INHALATION_SPRAY | Freq: Once | RESPIRATORY_TRACT | Status: AC
  Administered 2022-02-06: 4 via RESPIRATORY_TRACT
  Filled 2022-02-06: qty 6.7

## 2022-02-06 MED ORDER — SODIUM CHLORIDE 0.9 % IV BOLUS
500.0000 mL | Freq: Once | INTRAVENOUS | Status: AC
  Administered 2022-02-06: 500 mL via INTRAVENOUS

## 2022-02-06 MED ORDER — ALBUTEROL SULFATE (2.5 MG/3ML) 0.083% IN NEBU: 2.5000 mg | INHALATION_SOLUTION | Freq: Four times a day (QID) | RESPIRATORY_TRACT | 12 refills | Status: AC | PRN

## 2022-02-06 NOTE — Discharge Instructions (Addendum)
Take next dose of prednisone tomorrow morning.  Recommend doing albuterol nebulizer or 4 puffs of albuterol every 4-6 hours as needed.  Please return if symptoms worsen as discussed.

## 2022-02-06 NOTE — ED Triage Notes (Signed)
Patient brought in from home with c/o SOB. States he uses inhaler at home with no relief.

## 2022-02-06 NOTE — ED Provider Notes (Signed)
[facility] Provider Note   CSN: [id number] Arrival date & time: 02/06/22  1847     History  Chief Complaint  Patient presents with   Shortness of Breath    Jeff Savage is a 31 y.o. male.  Patient here with shortness of breath.  History of asthma.  Been using inhaler frequently today with not much relief.  He got DuoNeb treatment, IV Solu-Medrol with EMS.  He had normal vitals.  Was diminished per EMS.  He denies any cough or sputum production or fever.  No sick contacts.  Nothing makes it worse or better.  Denies any abdominal pain, chest pain.  The history is provided by the patient and the EMS personnel.       Home Medications Prior to Admission medications   Medication Sig Start Date End Date Taking? Authorizing Provider  albuterol (PROVENTIL) (2.5 MG/3ML) 0.083% nebulizer solution Take 3 mLs (2.5 mg total) by nebulization every 6 (six) hours as needed for wheezing or shortness of breath. 02/06/22  Yes Clementina Mareno, DO  predniSONE (DELTASONE) 20 MG tablet Take 3 tablets (60 mg total) by mouth daily for 5 days. 02/06/22 02/11/22 Yes Ivylynn Hoppes, DO  albuterol (VENTOLIN HFA) 108 (90 Base) MCG/ACT inhaler Inhale 1-2 puffs into the lungs every 6 (six) hours as needed for wheezing or shortness of breath. 01/18/22   Mare Ferrari, PA-C  budesonide-formoterol (SYMBICORT) 160-4.5 MCG/ACT inhaler Inhale 2 puffs into the lungs 2 (two) times daily. 06/29/21   Molpus, John, MD  oxyCODONE (OXY IR/ROXICODONE) 5 MG immediate release tablet Take 1 tablet (5 mg total) by mouth every 4 (four) hours as needed. 09/14/21   Barnetta Chapel, PA-C      Allergies    Shellfish allergy    Review of Systems   Review of Systems  Physical Exam Updated Vital Signs BP 128/75   Pulse (!) 126   Temp 98.1 F (36.7 C) (Oral)   Resp 20   Ht 5\' 9"  (1.753 m)   Wt 95.3 kg   SpO2 98%   BMI 31.01 kg/m  Physical Exam Vitals and nursing note reviewed.   Constitutional:      General: He is not in acute distress.    Appearance: He is well-developed. He is not ill-appearing.  HENT:     Head: Normocephalic and atraumatic.     Mouth/Throat:     Mouth: Mucous membranes are moist.  Eyes:     Extraocular Movements: Extraocular movements intact.     Conjunctiva/sclera: Conjunctivae normal.     Pupils: Pupils are equal, round, and reactive to light.  Cardiovascular:     Rate and Rhythm: Normal rate and regular rhythm.     Pulses: Normal pulses.     Heart sounds: Normal heart sounds. No murmur heard. Pulmonary:     Effort: Tachypnea present. No respiratory distress.     Breath sounds: Decreased breath sounds and wheezing present.  Abdominal:     Palpations: Abdomen is soft.     Tenderness: There is no abdominal tenderness.  Musculoskeletal:        General: No swelling.     Cervical back: Normal range of motion and neck supple.  Skin:    General: Skin is warm and dry.     Capillary Refill: Capillary refill takes less than 2 seconds.  Neurological:     General: No focal deficit present.     Mental Status: He is alert.  Psychiatric:  Mood and Affect: Mood normal.     ED Results / Procedures / Treatments   Labs (all labs ordered are listed, but only abnormal results are displayed) Labs Reviewed  CBC WITH DIFFERENTIAL/PLATELET - Abnormal; Notable for the following components:      Result Value   WBC 10.9 (*)    All other components within normal limits  RESP PANEL BY RT-PCR (RSV, FLU A&B, COVID)  [id number]  BASIC METABOLIC PANEL    EKG None  Radiology DG Chest Portable 1 View  Result Date: 02/06/2022 CLINICAL DATA:  Cough EXAM: PORTABLE CHEST 1 VIEW COMPARISON:  None Available. FINDINGS: The heart size and mediastinal contours are within normal limits. Both lungs are clear. The visualized skeletal structures are unremarkable. IMPRESSION: No active disease. Electronically Signed   By: Helyn Numbers M.D.   On: 02/06/2022  19:35    Procedures Procedures    Medications Ordered in ED Medications  ipratropium-albuterol (DUONEB) 0.5-2.5 (3) MG/3ML nebulizer solution 3 mL (3 mLs Nebulization Given 02/06/22 1955)  magnesium sulfate IVPB 2 g 50 mL (0 g Intravenous Stopped 02/06/22 2100)  sodium chloride 0.9 % bolus 500 mL (500 mLs Intravenous New Bag/Given 02/06/22 2112)  albuterol (VENTOLIN HFA) 108 (90 Base) MCG/ACT inhaler 4 puff (4 puffs Inhalation Given 02/06/22 2111)    ED Course/ Medical Decision Making/ A&P                           Medical Decision Making Amount and/or Complexity of Data Reviewed Labs: ordered. Radiology: ordered.  Risk Prescription drug management.   Jeff Savage is here with shortness of breath.  Mildly tachycardic and hypertensive upon arrival.  Room air oxygenation is 93%.  He had increased work of breathing, wheezing and diminished breath sounds throughout.  He is already been given breathing treatment, IV steroids with EMS.  History of asthma.  Differential diagnosis likely asthma exacerbation could be in the setting of weather related changes or viral process or may be pneumonia.  Will check basic labs, chest x-ray.  Will give him additional breathing treatment, IV magnesium and reevaluate.  Per my review interpretation of chest x-ray there is no pneumonia.  COVID and flu test negative.  Lab work shows no significant anemia, electrolyte abnormality or kidney injury.  He is feeling much improved after breathing treatments.  He was able to ambulate and maintain his pulse ox in the 90s.  Overall shared decision was made to have him continue treatment at home with steroids and breathing treatments.  He understands return precautions.  Discharged in good condition.  Overall patient with asthma exacerbation.  This chart was dictated using voice recognition software.  Despite best efforts to proofread,  errors can occur which can change the documentation meaning.          Final Clinical Impression(s) / ED Diagnoses Final diagnoses:  Mild asthma with exacerbation, unspecified whether persistent    Rx / DC Orders ED Discharge Orders          Ordered    predniSONE (DELTASONE) 20 MG tablet  Daily        02/06/22 2153    albuterol (PROVENTIL) (2.5 MG/3ML) 0.083% nebulizer solution  Every 6 hours PRN        02/06/22 2153              Virgina Norfolk, DO 02/06/22 2153

## 2022-02-19 ENCOUNTER — Encounter (HOSPITAL_BASED_OUTPATIENT_CLINIC_OR_DEPARTMENT_OTHER): Payer: Self-pay | Admitting: Emergency Medicine

## 2022-02-19 ENCOUNTER — Other Ambulatory Visit: Payer: Self-pay

## 2022-02-19 ENCOUNTER — Emergency Department (HOSPITAL_BASED_OUTPATIENT_CLINIC_OR_DEPARTMENT_OTHER)
Admission: EM | Admit: 2022-02-19 | Discharge: 2022-02-19 | Disposition: A | Discharge status: Home / Self Care | Payer: Self-pay | Attending: Emergency Medicine | Admitting: Emergency Medicine

## 2022-02-19 DIAGNOSIS — Z79899 Other long term (current) drug therapy: Secondary | ICD-10-CM | POA: Insufficient documentation

## 2022-02-19 DIAGNOSIS — Z7951 Long term (current) use of inhaled steroids: Secondary | ICD-10-CM | POA: Insufficient documentation

## 2022-02-19 DIAGNOSIS — J4541 Moderate persistent asthma with (acute) exacerbation: Secondary | ICD-10-CM | POA: Insufficient documentation

## 2022-02-19 DIAGNOSIS — I10 Essential (primary) hypertension: Secondary | ICD-10-CM | POA: Insufficient documentation

## 2022-02-19 MED ORDER — IPRATROPIUM-ALBUTEROL 0.5-2.5 (3) MG/3ML IN SOLN
3.0000 mL | Freq: Once | RESPIRATORY_TRACT | Status: AC
  Administered 2022-02-19: 3 mL via RESPIRATORY_TRACT
  Filled 2022-02-19: qty 3

## 2022-02-19 MED ORDER — ALBUTEROL SULFATE (2.5 MG/3ML) 0.083% IN NEBU
INHALATION_SOLUTION | RESPIRATORY_TRACT | Status: AC
  Administered 2022-02-19: 5 mg via RESPIRATORY_TRACT
  Filled 2022-02-19: qty 6

## 2022-02-19 MED ORDER — BUDESONIDE-FORMOTEROL FUMARATE 160-4.5 MCG/ACT IN AERO: 2.0000 | INHALATION_SPRAY | Freq: Two times a day (BID) | RESPIRATORY_TRACT | 12 refills | Status: AC

## 2022-02-19 MED ORDER — ALBUTEROL SULFATE (2.5 MG/3ML) 0.083% IN NEBU: 5.0000 mg | INHALATION_SOLUTION | Freq: Once | RESPIRATORY_TRACT | Status: AC

## 2022-02-19 MED ORDER — PREDNISONE 50 MG PO TABS: ORAL_TABLET | ORAL | 0 refills | Status: DC

## 2022-02-19 MED ORDER — AMLODIPINE BESYLATE 5 MG PO TABS: 5.0000 mg | ORAL_TABLET | Freq: Every day | ORAL | 1 refills | Status: AC

## 2022-02-19 MED ORDER — ALBUTEROL SULFATE HFA 108 (90 BASE) MCG/ACT IN AERS
2.0000 | INHALATION_SPRAY | RESPIRATORY_TRACT | Status: DC | PRN
  Administered 2022-02-19: 2 via RESPIRATORY_TRACT
  Filled 2022-02-19: qty 6.7

## 2022-02-19 MED ORDER — ALBUTEROL SULFATE (2.5 MG/3ML) 0.083% IN NEBU
2.5000 mg | INHALATION_SOLUTION | Freq: Once | RESPIRATORY_TRACT | Status: AC
  Administered 2022-02-19: 2.5 mg via RESPIRATORY_TRACT
  Filled 2022-02-19: qty 3

## 2022-02-19 MED ORDER — PREDNISONE 50 MG PO TABS
60.0000 mg | ORAL_TABLET | Freq: Once | ORAL | Status: AC
  Administered 2022-02-19: 60 mg via ORAL
  Filled 2022-02-19: qty 1

## 2022-02-19 MED ORDER — ALBUTEROL SULFATE (2.5 MG/3ML) 0.083% IN NEBU
5.0000 mg | INHALATION_SOLUTION | Freq: Once | RESPIRATORY_TRACT | Status: AC
  Administered 2022-02-19: 5 mg via RESPIRATORY_TRACT
  Filled 2022-02-19: qty 6

## 2022-02-19 NOTE — ED Triage Notes (Signed)
SOB X 2-3 days, tried home meds with no relief.

## 2022-02-19 NOTE — ED Provider Notes (Signed)
[facility] EMERGENCY DEPARTMENT Provider Note   CSN: [id number] Arrival date & time: 02/19/22  7619     History  Chief Complaint  Patient presents with   Shortness of Breath    Jeff Savage is a 32 y.o. male.  The history is provided by the patient.  Patient presents with an asthma exacerbation.  Patient reports long history of this.  He reports for the past 2-3 days has had increasing wheezing, cough and shortness of breath.  No fevers or vomiting.  No hemoptysis.  He reports this feels similar to prior episodes.  He just quit smoking about a month ago.  He also reports he has run out of his Symbicort.  He reports he is "trying to get back to [place]" where all of his medications and physicians are located     Home Medications Prior to Admission medications   Medication Sig Start Date End Date Taking? Authorizing Provider  amLODipine (NORVASC) 5 MG tablet Take 1 tablet (5 mg total) by mouth daily. 02/19/22  Yes Ripley Fraise, MD  budesonide-formoterol [facility]) 160-4.5 MCG/ACT inhaler Inhale 2 puffs into the lungs 2 (two) times daily. 02/19/22  Yes Ripley Fraise, MD  predniSONE (DELTASONE) 50 MG tablet 1 tablet PO QD X4 days 02/19/22  Yes Ripley Fraise, MD  albuterol (PROVENTIL) (2.5 MG/3ML) 0.083% nebulizer solution Take 3 mLs (2.5 mg total) by nebulization every 6 (six) hours as needed for wheezing or shortness of breath. 02/06/22   Curatolo, Adam, DO  albuterol (VENTOLIN HFA) 108 (90 Base) MCG/ACT inhaler Inhale 1-2 puffs into the lungs every 6 (six) hours as needed for wheezing or shortness of breath. 01/18/22   Garald Balding, PA-C      Allergies    Shellfish allergy    Review of Systems   Review of Systems  Constitutional:  Negative for fever.  Respiratory:  Positive for cough, shortness of breath and wheezing.   Cardiovascular:  Negative for leg swelling.    Physical Exam Updated Vital Signs BP (!) 138/104   Pulse (!) 105   Temp 97.7 F  (36.5 C)   Resp 20   Ht 1.753 m (5\' 9" )   Wt 95 kg   SpO2 100%   BMI 30.93 kg/m  Physical Exam CONSTITUTIONAL: Well developed/well nourished, no distress, actively receiving neb treatment HEAD: Normocephalic/atraumatic EYES: EOMI ENMT: Mucous membranes moist NECK: supple no meningeal signs CV: S1/S2 noted, no murmurs/rubs/gallops noted LUNGS: Mild tachypnea, wheezing bilaterally ABDOMEN: soft NEURO: Pt is awake/alert/appropriate, moves all extremitiesx4.  No facial droop.   EXTREMITIES: No lower extremity edema SKIN: warm, color normal PSYCH: no abnormalities of mood noted, alert and oriented to situation  ED Results / Procedures / Treatments   Labs (all labs ordered are listed, but only abnormal results are displayed) Labs Reviewed - No data to display  EKG None  Radiology No results found.  Procedures Procedures    Medications Ordered in ED Medications  albuterol (VENTOLIN HFA) 108 (90 Base) MCG/ACT inhaler 2 puff (2 puffs Inhalation Given 02/19/22 0608)  albuterol (PROVENTIL) (2.5 MG/3ML) 0.083% nebulizer solution 5 mg (has no administration in time range)  ipratropium-albuterol (DUONEB) 0.5-2.5 (3) MG/3ML nebulizer solution 3 mL (3 mLs Nebulization Given 02/19/22 0541)  albuterol (PROVENTIL) (2.5 MG/3ML) 0.083% nebulizer solution 2.5 mg (2.5 mg Nebulization Given 02/19/22 0541)  predniSONE (DELTASONE) tablet 60 mg (60 mg Oral Given 02/19/22 0546)  albuterol (PROVENTIL) (2.5 MG/3ML) 0.083% nebulizer solution 5 mg (5 mg Nebulization Given 02/19/22 0601)  ED Course/ Medical Decision Making/ A&P Clinical Course as of 02/19/22 6644  Ludwig Clarks Feb 19, 2022  0603 Patient with multiple ER visits for asthma exacerbation.  Patient reports all of his medications and physicians are in [place].  Will refill his Symbicort.  Will also give him his Norvasc which has been on previously.  Patient is receiving neb treatments and appears to be improving [DW]  [phone] Patient is improving,  though still has some wheeze.  He will be given 1 more treatment and then discharged home.  He reports he is going back to [place] on January 16.  He reports he has had good response to taking daily Symbicort and he will restart this.  He reports previous hospitalizations in [place], but has never been intubated [DW]    Clinical Course User Index [DW] Ripley Fraise, MD                           Medical Decision Making Risk Prescription drug management.   This patient presents to the ED for concern of shortness of breath, this involves an extensive number of treatment options, and is a complaint that carries with it a high risk of complications and morbidity.  The differential diagnosis includes but is not limited to Acute coronary syndrome, pneumonia, acute pulmonary edema, pneumothorax, acute anemia, pulmonary embolism   Comorbidities that complicate the patient evaluation: Patient's presentation is complicated by their history of asthma, hypertension  Social Determinants of Health: Patient's lack of insurance, lack of prescription access, impaired access to primary care, and recent tobacco use   increases the complexity of managing their presentation  Additional history obtained: Records reviewed Care Everywhere/External Records Per records, patient been treated for asthma and hypertension with Norvasc in [place] ordered and prescription drug management: I ordered medication including nebulized therapies and steroids for wheezing Reevaluation of the patient after these medicines showed that the patient    improved  Critical Interventions:   nebulized therapy and steroids   Reevaluation: After the interventions noted above, I reevaluated the patient and found that they have :improved  Complexity of problems addressed: Patient's presentation is most consistent with  exacerbation of chronic illness  Disposition: After consideration of the  diagnostic results and the patient's response to treatment,  I feel that the patent would benefit from discharge   .           Final Clinical Impression(s) / ED Diagnoses Final diagnoses:  Moderate persistent asthma with exacerbation  Uncontrolled hypertension    Rx / DC Orders ED Discharge Orders          Ordered    budesonide-formoterol (SYMBICORT) 160-4.5 MCG/ACT inhaler  2 times daily        02/19/22 0558    predniSONE (DELTASONE) 50 MG tablet        02/19/22 0558    amLODipine (NORVASC) 5 MG tablet  Daily        02/19/22 0600              Ripley Fraise, MD 02/19/22 [phone]

## 2022-03-03 ENCOUNTER — Emergency Department (HOSPITAL_BASED_OUTPATIENT_CLINIC_OR_DEPARTMENT_OTHER): Payer: Self-pay

## 2022-03-03 ENCOUNTER — Encounter (HOSPITAL_BASED_OUTPATIENT_CLINIC_OR_DEPARTMENT_OTHER): Payer: Self-pay

## 2022-03-03 ENCOUNTER — Emergency Department (HOSPITAL_BASED_OUTPATIENT_CLINIC_OR_DEPARTMENT_OTHER)
Admission: EM | Admit: 2022-03-03 | Discharge: 2022-03-03 | Disposition: A | Discharge status: Home / Self Care | Payer: Self-pay | Attending: Emergency Medicine | Admitting: Emergency Medicine

## 2022-03-03 DIAGNOSIS — J4541 Moderate persistent asthma with (acute) exacerbation: Secondary | ICD-10-CM | POA: Insufficient documentation

## 2022-03-03 DIAGNOSIS — R030 Elevated blood-pressure reading, without diagnosis of hypertension: Secondary | ICD-10-CM | POA: Insufficient documentation

## 2022-03-03 DIAGNOSIS — R000 Tachycardia, unspecified: Secondary | ICD-10-CM | POA: Insufficient documentation

## 2022-03-03 DIAGNOSIS — J45901 Unspecified asthma with (acute) exacerbation: Secondary | ICD-10-CM

## 2022-03-03 DIAGNOSIS — Z7951 Long term (current) use of inhaled steroids: Secondary | ICD-10-CM | POA: Insufficient documentation

## 2022-03-03 LAB — CBC WITH DIFFERENTIAL/PLATELET
Abs Immature Granulocytes: 0.02 10*3/uL (ref 0.00–0.07)
Basophils Absolute: 0 10*3/uL (ref 0.0–0.1)
Basophils Relative: 0 %
Eosinophils Absolute: 0.7 10*3/uL — ABNORMAL HIGH (ref 0.0–0.5)
Eosinophils Relative: 10 %
HCT: 46 % (ref 39.0–52.0)
Hemoglobin: 16 g/dL (ref 13.0–17.0)
Immature Granulocytes: 0 %
Lymphocytes Relative: 29 %
Lymphs Abs: 2.1 10*3/uL (ref 0.7–4.0)
MCH: 30.2 pg (ref 26.0–34.0)
MCHC: 34.8 g/dL (ref 30.0–36.0)
MCV: 86.8 fL (ref 80.0–100.0)
Monocytes Absolute: 0.6 10*3/uL (ref 0.1–1.0)
Monocytes Relative: 8 %
Neutro Abs: 3.9 10*3/uL (ref 1.7–7.7)
Neutrophils Relative %: 53 %
Platelets: 286 10*3/uL (ref 150–400)
RBC: 5.3 MIL/uL (ref 4.22–5.81)
RDW: 13.6 % (ref 11.5–15.5)
WBC: 7.4 10*3/uL (ref 4.0–10.5)
nRBC: 0 % (ref 0.0–0.2)

## 2022-03-03 LAB — BASIC METABOLIC PANEL
Anion gap: 8 (ref 5–15)
BUN: 12 mg/dL (ref 6–20)
CO2: 24 mmol/L (ref 22–32)
Calcium: 8.7 mg/dL — ABNORMAL LOW (ref 8.9–10.3)
Chloride: 106 mmol/L (ref 98–111)
Creatinine, Ser: 1 mg/dL (ref 0.61–1.24)
GFR, Estimated: >60 mL/min (ref >60)
Glucose, Bld: 106 mg/dL — ABNORMAL HIGH (ref 70–99)
Potassium: 3.7 mmol/L (ref 3.5–5.1)
Sodium: 138 mmol/L (ref 135–145)

## 2022-03-03 LAB — MAGNESIUM: Magnesium: 2.1 mg/dL (ref 1.7–2.4)

## 2022-03-03 MED ORDER — MAGNESIUM SULFATE 50 % IJ SOLN
1.0000 g | Freq: Once | INTRAMUSCULAR | Status: AC
  Administered 2022-03-03: 1 g via INTRAVENOUS
  Filled 2022-03-03: qty 2

## 2022-03-03 MED ORDER — IPRATROPIUM-ALBUTEROL 0.5-2.5 (3) MG/3ML IN SOLN
RESPIRATORY_TRACT | Status: AC
  Administered 2022-03-03: 3 mL
  Filled 2022-03-03: qty 3

## 2022-03-03 MED ORDER — ALBUTEROL SULFATE (2.5 MG/3ML) 0.083% IN NEBU
INHALATION_SOLUTION | RESPIRATORY_TRACT | Status: AC
  Administered 2022-03-03: 2.5 mg
  Filled 2022-03-03: qty 3

## 2022-03-03 MED ORDER — ALBUTEROL (5 MG/ML) CONTINUOUS INHALATION SOLN: 10.0000 mg/h | INHALATION_SOLUTION | Freq: Once | RESPIRATORY_TRACT | Status: DC

## 2022-03-03 MED ORDER — ALBUTEROL SULFATE HFA 108 (90 BASE) MCG/ACT IN AERS
2.0000 | INHALATION_SPRAY | RESPIRATORY_TRACT | Status: DC | PRN
  Administered 2022-03-03: 2 via RESPIRATORY_TRACT
  Filled 2022-03-03: qty 6.7

## 2022-03-03 MED ORDER — METHYLPREDNISOLONE SODIUM SUCC 125 MG IJ SOLR
125.0000 mg | Freq: Once | INTRAMUSCULAR | Status: AC
  Administered 2022-03-03: 125 mg via INTRAVENOUS
  Filled 2022-03-03: qty 2

## 2022-03-03 MED ORDER — PREDNISONE 10 MG PO TABS: 40.0000 mg | ORAL_TABLET | Freq: Every day | ORAL | 0 refills | Status: AC

## 2022-03-03 MED ORDER — ALBUTEROL SULFATE (2.5 MG/3ML) 0.083% IN NEBU
INHALATION_SOLUTION | RESPIRATORY_TRACT | Status: AC
  Administered 2022-03-03: 10 mg
  Filled 2022-03-03: qty 12

## 2022-03-03 NOTE — Discharge Instructions (Addendum)
Please pick up the steroids I prescribed for you.  Please use the albuterol inhaler for any wheezing or shortness of breath.  Please make appoint with your primary care when you return home to be seen for long-term management and smoking cessation.  If symptoms worsen please return to ER.

## 2022-03-03 NOTE — ED Notes (Signed)
Pulled rainbow tubes since starting IV.

## 2022-03-03 NOTE — ED Triage Notes (Signed)
SOB since this morning, hx of asthma, been taking inhaler without relief. Out of nebulizers. Last took inhaler 10 mins pta.   Ambulatory to room, pulse ox reading 90-92% on RA, HR 117-125

## 2022-03-03 NOTE — ED Provider Notes (Signed)
[place] EMERGENCY DEPARTMENT Provider Note   CSN: [id number] Arrival date & time: 03/03/22  0913     History  Chief Complaint  Patient presents with   Shortness of Breath    Jeff Savage is a 32 y.o. male history of asthma presents today with shortness of breath that began this morning.  Patient stated that this feels like her previous asthma exacerbation.  Patient took his albuterol inhaler this morning to no relief.  Patient endorsed being seen recently for same symptoms and has not followed up with primary care or a pulmonologist due to the fact he is is going back to [place] where his primary care is at the end of the month. Patient endorsed 1/2 ppd cigarettes.  Patient denied chest pain, syncope, nausea/vomiting, diarrhea, fevers, recent illnesses, dysuria, abdominal pain, hemoptysis.  Home Medications Prior to Admission medications   Medication Sig Start Date End Date Taking? Authorizing Provider  predniSONE (DELTASONE) 10 MG tablet Take 4 tablets (40 mg total) by mouth daily. 03/03/22  Yes Ed Mandich, Jeneen Rinks T, PA-C  albuterol (PROVENTIL) (2.5 MG/3ML) 0.083% nebulizer solution Take 3 mLs (2.5 mg total) by nebulization every 6 (six) hours as needed for wheezing or shortness of breath. 02/06/22   Curatolo, Adam, DO  albuterol (VENTOLIN HFA) 108 (90 Base) MCG/ACT inhaler Inhale 1-2 puffs into the lungs every 6 (six) hours as needed for wheezing or shortness of breath. 01/18/22   Garald Balding, PA-C  amLODipine (NORVASC) 5 MG tablet Take 1 tablet (5 mg total) by mouth daily. 02/19/22   Ripley Fraise, MD  budesonide-formoterol [facility]) 160-4.5 MCG/ACT inhaler Inhale 2 puffs into the lungs 2 (two) times daily. 02/19/22   Ripley Fraise, MD      Allergies    Shellfish allergy    Review of Systems   Review of Systems  Respiratory:  Positive for shortness of breath.   See HPI  Physical Exam Updated Vital Signs BP (!) 155/99   Pulse (!) 108   Temp 98.4 F  (36.9 C) (Oral)   Resp 20   Ht 5\' 9"  (1.753 m)   Wt 99.8 kg   SpO2 94%   BMI 32.49 kg/m  Physical Exam Vitals and nursing note reviewed.  Constitutional:      General: He is not in acute distress.    Appearance: He is well-developed.  HENT:     Head: Normocephalic and atraumatic.  Eyes:     Extraocular Movements: Extraocular movements intact.     Conjunctiva/sclera: Conjunctivae normal.     Pupils: Pupils are equal, round, and reactive to light.  Cardiovascular:     Rate and Rhythm: Normal rate and regular rhythm.     Heart sounds: No murmur heard. Pulmonary:     Effort: No respiratory distress.     Breath sounds: Wheezing (Bilateral) present.     Comments: Patient was receiving duoneb treatment from RT when I entered the room Abdominal:     Palpations: Abdomen is soft.     Tenderness: There is no abdominal tenderness.  Musculoskeletal:        General: No swelling. Normal range of motion.     Cervical back: Normal range of motion and neck supple.  Skin:    General: Skin is warm and dry.     Capillary Refill: Capillary refill takes less than 2 seconds.  Neurological:     General: No focal deficit present.     Mental Status: He is alert and oriented to person, place,  and time.  Psychiatric:        Mood and Affect: Mood normal.     ED Results / Procedures / Treatments   Labs (all labs ordered are listed, but only abnormal results are displayed) Labs Reviewed  BASIC METABOLIC PANEL - Abnormal; Notable for the following components:      Result Value   Glucose, Bld 106 (*)    Calcium 8.7 (*)    All other components within normal limits  CBC WITH DIFFERENTIAL/PLATELET - Abnormal; Notable for the following components:   Eosinophils Absolute 0.7 (*)    All other components within normal limits  MAGNESIUM    EKG None  Radiology DG Chest 2 View  Result Date: 03/03/2022 CLINICAL DATA:  Shortness of breath and mild chest tightness. History of asthma. EXAM: CHEST - 2  VIEW COMPARISON:  Chest radiographs 02/06/2022 FINDINGS: The cardiomediastinal silhouette is within normal limits. The lungs are well inflated with mild peribronchial thickening. No airspace consolidation, edema, pleural effusion, or pneumothorax is identified. Right upper quadrant abdominal surgical clips are noted. No acute osseous abnormality is seen. IMPRESSION: Peribronchial thickening which could reflect bronchitis or reactive airways disease. Electronically Signed   By: Logan Bores M.D.   On: 03/03/2022 09:53    Procedures Procedures    Medications Ordered in ED Medications  albuterol (PROVENTIL,VENTOLIN) solution continuous neb (10 mg/hr Nebulization Not Given 03/03/22 0958)  albuterol (VENTOLIN HFA) 108 (90 Base) MCG/ACT inhaler 2 puff (2 puffs Inhalation Given 03/03/22 1053)  ipratropium-albuterol (DUONEB) 0.5-2.5 (3) MG/3ML nebulizer solution (3 mLs  Given 03/03/22 0923)  albuterol (PROVENTIL) (2.5 MG/3ML) 0.083% nebulizer solution (2.5 mg  Given 03/03/22 1610)  methylPREDNISolone sodium succinate (SOLU-MEDROL) 125 mg/2 mL injection 125 mg (125 mg Intravenous Given 03/03/22 0950)  magnesium sulfate (IV Push/IM) injection 1 g (1 g Intravenous Given 03/03/22 0950)  albuterol (PROVENTIL) (2.5 MG/3ML) 0.083% nebulizer solution (10 mg  Given 03/03/22 9604)    ED Course/ Medical Decision Making/ A&P                             Medical Decision Making Amount and/or Complexity of Data Reviewed Labs: ordered. Radiology: ordered.  Risk Prescription drug management.   Lenoria Farrier 32 y.o. presented today for [facility]. Working DDx that I considered at this time includes, but not limited to, viral illness, PNA, asthma exacerbation, sepsis.  Review of prior external notes: 02/19/22 ED Visit  Unique Tests and My Interpretation:  Chest x-ray: Peribronchial thickening indicative of reactive airway disease CBC with differential: Unremarkable BMP: Unremarkable Mag: Unremarkable  Discussion  with Independent Historian: None  Discussion of Management of Tests: None  Risk:   Medium:  - prescription drug management  Risk Stratification Score: None  Staffed with Domenic Moras, PA-C  R/o DDx: PNA: Chest x-ray showed no focal consolidations and patient denied any systemic symptoms of infection Sepsis: Patient does not meet sepsis criteria  Plan: Patient presented with shortness of breath that began this morning.  Patient has been seen multiple times for asthma exacerbation that I suspect that is the cause for his symptoms today.  Patient was given a DuoNeb by RT before I walked into the room initially due to having wheezing at rest. Labs/imaging were ordered.  Patient was also given Solu-Medrol and magnesium sulfate.  Patient's vitals at this time are tachycardia to 109 and blood pressure 149/102.  I suspect that tachycardia is from the shortness of breath and albuterol  treatments and his BP is consistent with past readings. on recheck patient stated he was able to breathe better however after initial duoneb, patient had bilateral wheezing and so a continuous albuterol treatment was ordered.  On recheck patient still is wheezing bilaterally however patient endorsed improvement of symptoms and when he was walked on room air his O2 saturation was 93 to 94%.  Patient was slightly tachycardic at 108 right now with elevated blood pressure of 155/99 however I suspect this is due to all the albuterol treatments patient has received versus infectious or life threatening etiology.  After speaking with patient and patient appearing stable for discharge we agreed that patient could be discharged with a prednisone taper and the albuterol inhaler he received in the ED.  Patient agreed to follow-up with primary care regarding long-term management of asthma and smoking sensation when he returns home.  Patient verbalized agreement to this plan.         Final Clinical Impression(s) / ED  Diagnoses Final diagnoses:  Moderate asthma with exacerbation, unspecified whether persistent    Rx / DC Orders ED Discharge Orders          Ordered    predniSONE (DELTASONE) 10 MG tablet  Daily        03/03/22 1149              Remi Deter 03/03/22 1153    Rondel Baton, MD 03/05/22 1430

## 2022-03-03 NOTE — ED Notes (Signed)
Walked the pt with pulse ox, 93-94% with HR of 110s (due to albuterol). Pt advised he felt way better than when he came in, back to normal now. Pt made a lap around the ED without assistance, weakness, distress or SOB. The pt maintained 93% minimum throughout.

## 2022-03-03 NOTE — ED Notes (Signed)
Discharge paperwork reviewed entirely with patient, including Rx's and follow up care. Pain was under control. Pt verbalized understanding as well as all parties involved. No questions or concerns voiced at the time of discharge. No acute distress noted.   Pt ambulated out to PVA without incident or assistance.

## 2022-03-03 NOTE — ED Notes (Signed)
RT assessed upon entering room. RN had ambulated with pulse ox, SAT 92. Insp/exp wheezes. SOB noted. Patient given 5mg  Albuterol/ 035mg  Atrovent. RT to monitor as needed

## 2022-08-07 ENCOUNTER — Encounter (HOSPITAL_BASED_OUTPATIENT_CLINIC_OR_DEPARTMENT_OTHER): Payer: Self-pay | Admitting: Emergency Medicine

## 2022-08-07 ENCOUNTER — Other Ambulatory Visit: Payer: Self-pay

## 2022-08-07 ENCOUNTER — Emergency Department (HOSPITAL_BASED_OUTPATIENT_CLINIC_OR_DEPARTMENT_OTHER)
Admission: EM | Admit: 2022-08-07 | Discharge: 2022-08-07 | Disposition: A | Discharge status: Home / Self Care | Payer: Self-pay | Attending: Emergency Medicine | Admitting: Emergency Medicine

## 2022-08-07 ENCOUNTER — Emergency Department (HOSPITAL_BASED_OUTPATIENT_CLINIC_OR_DEPARTMENT_OTHER): Payer: Self-pay

## 2022-08-07 DIAGNOSIS — F17200 Nicotine dependence, unspecified, uncomplicated: Secondary | ICD-10-CM | POA: Insufficient documentation

## 2022-08-07 DIAGNOSIS — J45909 Unspecified asthma, uncomplicated: Secondary | ICD-10-CM | POA: Insufficient documentation

## 2022-08-07 DIAGNOSIS — M79631 Pain in right forearm: Secondary | ICD-10-CM | POA: Insufficient documentation

## 2022-08-07 DIAGNOSIS — M79671 Pain in right foot: Secondary | ICD-10-CM | POA: Insufficient documentation

## 2022-08-07 DIAGNOSIS — Z7951 Long term (current) use of inhaled steroids: Secondary | ICD-10-CM | POA: Insufficient documentation

## 2022-08-07 DIAGNOSIS — Z7952 Long term (current) use of systemic steroids: Secondary | ICD-10-CM | POA: Insufficient documentation

## 2022-08-07 LAB — CBC WITH DIFFERENTIAL/PLATELET
Abs Immature Granulocytes: 0.03 10*3/uL (ref 0.00–0.07)
Basophils Absolute: 0.1 10*3/uL (ref 0.0–0.1)
Basophils Relative: 1 %
Eosinophils Absolute: 0.2 10*3/uL (ref 0.0–0.5)
Eosinophils Relative: 2 %
HCT: 47.4 % (ref 39.0–52.0)
Hemoglobin: 16.6 g/dL (ref 13.0–17.0)
Immature Granulocytes: 0 %
Lymphocytes Relative: 23 %
Lymphs Abs: 2.4 10*3/uL (ref 0.7–4.0)
MCH: 30 pg (ref 26.0–34.0)
MCHC: 35 g/dL (ref 30.0–36.0)
MCV: 85.6 fL (ref 80.0–100.0)
Monocytes Absolute: 1.1 10*3/uL — ABNORMAL HIGH (ref 0.1–1.0)
Monocytes Relative: 11 %
Neutro Abs: 6.9 10*3/uL (ref 1.7–7.7)
Neutrophils Relative %: 63 %
Platelets: 247 10*3/uL (ref 150–400)
RBC: 5.54 MIL/uL (ref 4.22–5.81)
RDW: 12.5 % (ref 11.5–15.5)
WBC: 10.8 10*3/uL — ABNORMAL HIGH (ref 4.0–10.5)
nRBC: 0 % (ref 0.0–0.2)

## 2022-08-07 LAB — BASIC METABOLIC PANEL
Anion gap: 10 (ref 5–15)
BUN: 14 mg/dL (ref 6–20)
CO2: 23 mmol/L (ref 22–32)
Calcium: 9.2 mg/dL (ref 8.9–10.3)
Chloride: 102 mmol/L (ref 98–111)
Creatinine, Ser: 1.14 mg/dL (ref 0.61–1.24)
GFR, Estimated: >60 mL/min (ref >60)
Glucose, Bld: 90 mg/dL (ref 70–99)
Potassium: 3.9 mmol/L (ref 3.5–5.1)
Sodium: 135 mmol/L (ref 135–145)

## 2022-08-07 LAB — MAGNESIUM: Magnesium: 2.2 mg/dL (ref 1.7–2.4)

## 2022-08-07 MED ORDER — IBUPROFEN 600 MG PO TABS: 600.0000 mg | ORAL_TABLET | Freq: Four times a day (QID) | ORAL | 0 refills | Status: AC | PRN

## 2022-08-07 MED ORDER — ACETAMINOPHEN 500 MG PO TABS
1000.0000 mg | ORAL_TABLET | Freq: Once | ORAL | Status: AC
  Administered 2022-08-07: 1000 mg via ORAL
  Filled 2022-08-07: qty 2

## 2022-08-07 MED ORDER — OXYCODONE HCL 5 MG PO TABS: 5.0000 mg | ORAL_TABLET | Freq: Four times a day (QID) | ORAL | 0 refills | Status: AC | PRN

## 2022-08-07 MED ORDER — KETOROLAC TROMETHAMINE 60 MG/2ML IM SOLN
30.0000 mg | Freq: Once | INTRAMUSCULAR | Status: AC
  Administered 2022-08-07: 30 mg via INTRAMUSCULAR
  Filled 2022-08-07: qty 2

## 2022-08-07 MED ORDER — ACETAMINOPHEN 325 MG PO TABS: 650.0000 mg | ORAL_TABLET | Freq: Four times a day (QID) | ORAL | 0 refills | Status: AC | PRN

## 2022-08-07 NOTE — ED Notes (Signed)
Pt A&OX4 ambulatory with crutches at d/c. Pt verbalized understanding of d/c instructions, prescriptions and follow up care. Pt able to demonstrate proper use of crutches.

## 2022-08-07 NOTE — Discharge Instructions (Addendum)
It was a pleasure caring for you today in the emergency department. ° °Please return to the emergency department for any worsening or worrisome symptoms. ° ° °

## 2022-08-07 NOTE — ED Notes (Signed)
ED Provider at bedside. 

## 2022-08-07 NOTE — ED Triage Notes (Signed)
Pt via POV c/o right arm numbness since Thursday at around 10pm through the day yesterday, now resolved but new numbness noted to right foot since this morning. Hx asthma. No obvious neuro deficit noted and pt is ambulatory w/o assistance.

## 2022-08-08 ENCOUNTER — Other Ambulatory Visit: Payer: Self-pay

## 2022-08-08 ENCOUNTER — Emergency Department (HOSPITAL_BASED_OUTPATIENT_CLINIC_OR_DEPARTMENT_OTHER)
Admission: EM | Admit: 2022-08-08 | Discharge: 2022-08-08 | Disposition: A | Discharge status: Home / Self Care | Payer: Self-pay | Attending: Emergency Medicine | Admitting: Emergency Medicine

## 2022-08-08 ENCOUNTER — Encounter (HOSPITAL_BASED_OUTPATIENT_CLINIC_OR_DEPARTMENT_OTHER): Payer: Self-pay

## 2022-08-08 ENCOUNTER — Emergency Department (HOSPITAL_BASED_OUTPATIENT_CLINIC_OR_DEPARTMENT_OTHER): Payer: Self-pay

## 2022-08-08 DIAGNOSIS — R030 Elevated blood-pressure reading, without diagnosis of hypertension: Secondary | ICD-10-CM | POA: Insufficient documentation

## 2022-08-08 DIAGNOSIS — Z79899 Other long term (current) drug therapy: Secondary | ICD-10-CM | POA: Insufficient documentation

## 2022-08-08 DIAGNOSIS — M7701 Medial epicondylitis, right elbow: Secondary | ICD-10-CM | POA: Insufficient documentation

## 2022-08-08 MED ORDER — KETOROLAC TROMETHAMINE 15 MG/ML IJ SOLN
15.0000 mg | Freq: Once | INTRAMUSCULAR | Status: AC
  Administered 2022-08-08: 15 mg via INTRAMUSCULAR
  Filled 2022-08-08: qty 1

## 2022-08-08 MED ORDER — LIDOCAINE 5 % EX PTCH
1.0000 | MEDICATED_PATCH | CUTANEOUS | Status: DC
  Administered 2022-08-08: 1 via TRANSDERMAL
  Filled 2022-08-08: qty 1

## 2022-08-08 NOTE — ED Provider Notes (Signed)
[place] EMERGENCY DEPARTMENT AT [facility] Provider Note   CSN: [id number] Arrival date & time: 08/08/22  1444     History Chief Complaint  Patient presents with   Arm Pain    Jeff Savage is a 32 y.o. male self reportedly otherwise healthy presents the emergency room today for evaluation of right inner elbow pain for the past few days.  He still has full movement of the elbow however has pain with resting it.  He denies any numbness or tingling into the arm or hand.  Denies any fevers.  He denies any weakness.  Denies any neck pain or headaches.  No pain medication taken prior to arrival.  Denies any IV drug use.  Patient drives a truck for a living.  No known drug allergies.   Arm Pain       Home Medications Prior to Admission medications   Medication Sig Start Date End Date Taking? Authorizing Provider  acetaminophen (TYLENOL) 325 MG tablet Take 2 tablets (650 mg total) by mouth every 6 (six) hours as needed. 08/07/22   Tanda Rockers A, DO  albuterol (PROVENTIL) (2.5 MG/3ML) 0.083% nebulizer solution Take 3 mLs (2.5 mg total) by nebulization every 6 (six) hours as needed for wheezing or shortness of breath. 02/06/22   Curatolo, Adam, DO  albuterol (VENTOLIN HFA) 108 (90 Base) MCG/ACT inhaler Inhale 1-2 puffs into the lungs every 6 (six) hours as needed for wheezing or shortness of breath. 01/18/22   Mare Ferrari, PA-C  amLODipine (NORVASC) 5 MG tablet Take 1 tablet (5 mg total) by mouth daily. 02/19/22   Zadie Rhine, MD  budesonide-formoterol [facility]) 160-4.5 MCG/ACT inhaler Inhale 2 puffs into the lungs 2 (two) times daily. 02/19/22   Zadie Rhine, MD  ibuprofen (ADVIL) 600 MG tablet Take 1 tablet (600 mg total) by mouth every 6 (six) hours as needed. 08/07/22   Sloan Leiter, DO  oxyCODONE (ROXICODONE) 5 MG immediate release tablet Take 1 tablet (5 mg total) by mouth every 6 (six) hours as needed for severe pain. 08/07/22   Sloan Leiter, DO  predniSONE  (DELTASONE) 10 MG tablet Take 4 tablets (40 mg total) by mouth daily. 03/03/22   Netta Corrigan, PA-C      Allergies    Shellfish allergy    Review of Systems   Review of Systems  Constitutional:  Negative for chills and fever.  Musculoskeletal:  Positive for arthralgias.  Neurological:  Negative for tremors and weakness.  See HPI  Physical Exam Updated Vital Signs BP (!) 133/93 (BP Location: Left Arm)   Pulse 83   Temp 97.9 F (36.6 C) (Oral)   Resp 16   Ht 5\' 9"  (1.753 m)   Wt 100.6 kg   SpO2 96%   BMI 32.75 kg/m  Physical Exam Constitutional:      Appearance: Normal appearance.  Eyes:     General: No scleral icterus. Pulmonary:     Effort: Pulmonary effort is normal. No respiratory distress.  Musculoskeletal:     Comments: RUE -I do not appreciate any overlying warmth or erythema to the elbow joint.  Arms appear symmetric in size.  Coloration and temperature appear and feel symmetric as well.  Palpable radial pulses are symmetric.  Brisk cap refill on all 5 fingers.  He has full flexion extension of the elbow.  Tenderness into the medial epicondyle space.  Grip strength is equal bilaterally.  Equal strength with pushing and pulling bilaterally.  Compartments are  soft.  Finger thumb opposition intact in all 5 fingers with and without resistance.  Skin:    General: Skin is dry.     Findings: No rash.  Neurological:     General: No focal deficit present.     Mental Status: He is alert. Mental status is at baseline.  Psychiatric:        Mood and Affect: Mood normal.     ED Results / Procedures / Treatments   Labs (all labs ordered are listed, but only abnormal results are displayed) Labs Reviewed - No data to display  EKG None  Radiology DG Elbow Complete Right  Result Date: 08/08/2022 CLINICAL DATA:  Awoke this morning with pain and swelling, no known injury EXAM: RIGHT ELBOW - COMPLETE 3+ VIEW COMPARISON:  None Available. FINDINGS: There is no evidence of  fracture, dislocation, or joint effusion. There is no evidence of arthropathy or other focal bone abnormality. Soft tissue edema of the olecranon. IMPRESSION: 1. No fracture or dislocation of the right elbow. No elbow joint effusion. 2. Soft tissue edema of the olecranon. Electronically Signed   By: Jearld Lesch M.D.   On: 08/08/2022 16:20   DG Foot Complete Right  Result Date: 08/07/2022 CLINICAL DATA:  Pain and numbness noted to right foot at the distal aspect of the second through fourth metatarsal bones. EXAM: RIGHT FOOT COMPLETE - 3+ VIEW COMPARISON:  None Available. FINDINGS: There is no evidence of fracture or dislocation. There is no evidence of arthropathy or other focal bone abnormality. Soft tissues are unremarkable. IMPRESSION: Negative. Electronically Signed   By: Signa Kell M.D.   On: 08/07/2022 15:13    Procedures Procedures   Medications Ordered in ED Medications  lidocaine (LIDODERM) 5 % 1 patch (1 patch Transdermal Patch Applied 08/08/22 1755)  ketorolac (TORADOL) 15 MG/ML injection 15 mg (15 mg Intramuscular Given 08/08/22 1759)    ED Course/ Medical Decision Making/ A&P                            Medical Decision Making Amount and/or Complexity of Data Reviewed Radiology: ordered.  Risk Prescription drug management.   32 y.o. male presents to the ER today for evaluation of right elbow pain. Differential diagnosis includes but is not limited to septic arthritis, gout, epicondylitis, sprain, strain, contusion, arthritis. Vital signs show slightly elevated blood pressure otherwise unremarkable. Physical exam as noted above.   XR shows 1. No fracture or dislocation of the right elbow. No elbow joint effusion. 2. Soft tissue edema of the olecranon.   Patient denies any injury or fall to the area.  I do not appreciate any overlying skin changes, erythema, warmth, fluctuance, or induration.  Arms appear symmetric in both size and coloration.  Feel symmetric with  temperature.  His full flexion extension of the elbow and has tenderness over the medial epicondyles, consistent with medial epicondylitis.  The patient reports that he just got back from a long trip from [place] and was resting his arm on the console while he was driving and started to have pain.  He reports it hurts worse never he rests his elbow on a surface as well.  At this point, I have a low suspicion for any septic arthritis the patient has full flexion extension actively at the elbow.  Compartments are soft.  I do not appreciate any overlying warmth or erythema to suggest any abscess to the area.  I will suspicion  for any gout.  His vital signs are stable and is afebrile and not tachycardic.  He does not have any other tenderness into the forearm or upper arm.  Grip strength is equally.  I recommended he get a Helbow pad for whenever he makes long trips as he may have irritated the nerve that is in there.  We discussed using Tylenol and ibuprofen for pain.  We discussed following up with hand surgery.  Will give patient Toradol shot and lidocaine patch while here.  Patient is agreeable to the plan and assures me he will follow-up with hand surgery.  We discussed plan at bedside. We discussed strict return precautions and red flag symptoms. The patient verbalized their understanding and agrees to the plan. The patient is stable and being discharged home in good condition.  Portions of this report may have been transcribed using voice recognition software. Every effort was made to ensure accuracy; however, inadvertent computerized transcription errors may be present.   Final Clinical Impression(s) / ED Diagnoses Final diagnoses:  Medial epicondylitis of right elbow    Rx / DC Orders ED Discharge Orders     None         Achille Rich, PA-C 08/11/22 1057    Arby Barrette, MD 08/17/22 [id number]

## 2022-08-08 NOTE — ED Triage Notes (Signed)
Pt was seen here yesterday, was seen for foot pain States he told staff yesterday about rt. Arm pain and swelling, no xray done No accident or injury states he just woke up and had pain

## 2022-08-08 NOTE — ED Notes (Signed)
Cannot discharge, registration in chart.   

## 2022-08-08 NOTE — Discharge Instructions (Signed)
You were seen in the ER today for evaluation of your right elbow pain. I likely think this is medial epicondylitis from leaning on your elbow during a long ride.  For this, I would like you to take 600 mg of ibuprofen every 6 hours at least for the next 24 to 48 hours.  Can also try ice to the area or a wrap.  I recommend taking of a brace for epicondylitis at your local drugstore.  I included information for hand specialist to follow-up with visit with elbows as well.  If you have any worsening pain to the elbow, swelling, color changes, numbness tingling down the arm or any weakness or fever, please return to the nearest emergency room for evaluation.  If any concerns, new or worsening symptoms, please return to the nearest emergency room for evaluation.  Contact a health care provider if: Your pain does not improve or it gets worse. You notice numbness in your hand. Get help right away if: Your pain is severe. You cannot move your wrist.

## 2022-08-16 NOTE — ED Provider Notes (Signed)
[facility] Provider Note  CSN: [id number] Arrival date & time: 08/07/22 1127  Chief Complaint(s) No chief complaint on file.  HPI Jeff Savage is a 32 y.o. male with past medical history as below, significant for asthma who presents to the ED with complaint of arm/foot pain. Onset over 24 hours ago. Tingling to his right forearm and somewhat to his toes on the right side. No recent falls or injuries. No fevers or rashes. No medications he has tried at home. Pain to forearm is worse w/ movement. Pain to foot is worse w/ movement. Denies similar symptoms in the past, symptoms intermittent. No cp or dib, no abd pain, n/v, no change to bowel/bladder fxn    Past Medical History Past Medical History:  Diagnosis Date   Asthma    There are no problems to display for this patient.  Home Medication(s) Prior to Admission medications   Medication Sig Start Date End Date Taking? Authorizing Provider  acetaminophen (TYLENOL) 325 MG tablet Take 2 tablets (650 mg total) by mouth every 6 (six) hours as needed. 08/07/22  Yes Tanda Rockers A, DO  ibuprofen (ADVIL) 600 MG tablet Take 1 tablet (600 mg total) by mouth every 6 (six) hours as needed. 08/07/22  Yes Tanda Rockers A, DO  oxyCODONE (ROXICODONE) 5 MG immediate release tablet Take 1 tablet (5 mg total) by mouth every 6 (six) hours as needed for severe pain. 08/07/22  Yes Tanda Rockers A, DO  albuterol (PROVENTIL) (2.5 MG/3ML) 0.083% nebulizer solution Take 3 mLs (2.5 mg total) by nebulization every 6 (six) hours as needed for wheezing or shortness of breath. 02/06/22   Curatolo, Adam, DO  albuterol (VENTOLIN HFA) 108 (90 Base) MCG/ACT inhaler Inhale 1-2 puffs into the lungs every 6 (six) hours as needed for wheezing or shortness of breath. 01/18/22   Mare Ferrari, PA-C  amLODipine (NORVASC) 5 MG tablet Take 1 tablet (5 mg total) by mouth daily. 02/19/22   Zadie Rhine, MD  budesonide-formoterol  [facility]) 160-4.5 MCG/ACT inhaler Inhale 2 puffs into the lungs 2 (two) times daily. 02/19/22   Zadie Rhine, MD  predniSONE (DELTASONE) 10 MG tablet Take 4 tablets (40 mg total) by mouth daily. 03/03/22   Remi Deter                                                                                                                                    Past Surgical History Past Surgical History:  Procedure Laterality Date   CHOLECYSTECTOMY N/A 09/14/2021   Procedure: LAPAROSCOPIC CHOLECYSTECTOMY;  Surgeon: Quentin Ore, MD;  Location: [facility];  Service: General;  Laterality: N/A;   Family History History reviewed. No pertinent family history.  Social History Social History   Tobacco Use   Smoking status: Every Day    Packs/day: .5    Types: Cigarettes   Smokeless tobacco: Never   Tobacco  comments:    stopped 2 weeks ago as of 08/29/2019  Vaping Use   Vaping Use: Never used  Substance Use Topics   Alcohol use: Yes    Comment: occ   Drug use: Yes    Types: Marijuana   Allergies Shellfish allergy  Review of Systems Review of Systems  Constitutional:  Negative for chills and fever.  HENT:  Negative for facial swelling and trouble swallowing.   Eyes:  Negative for photophobia and visual disturbance.  Respiratory:  Negative for cough and shortness of breath.   Cardiovascular:  Negative for chest pain and palpitations.  Gastrointestinal:  Negative for abdominal pain, nausea and vomiting.  Endocrine: Negative for polydipsia and polyuria.  Genitourinary:  Negative for difficulty urinating and hematuria.  Musculoskeletal:  Positive for arthralgias. Negative for gait problem and joint swelling.  Skin:  Negative for pallor and rash.  Neurological:  Negative for syncope and headaches.  Psychiatric/Behavioral:  Negative for agitation and confusion.     Physical Exam Vital Signs  I have reviewed the triage vital signs BP (!) 120/92   Pulse 91   Temp 98.3 F (36.8  C) (Oral)   Resp 16   Ht 5\' 9"  (1.753 m)   Wt 100.7 kg   SpO2 98%   BMI 32.78 kg/m  Physical Exam Vitals and nursing note reviewed.  Constitutional:      General: He is not in acute distress.    Appearance: Normal appearance. He is well-developed.  HENT:     Head: Normocephalic and atraumatic.     Right Ear: External ear normal.     Left Ear: External ear normal.     Mouth/Throat:     Mouth: Mucous membranes are moist.  Eyes:     General: No scleral icterus. Cardiovascular:     Rate and Rhythm: Normal rate and regular rhythm.     Pulses: Normal pulses.  Pulmonary:     Effort: Pulmonary effort is normal. No respiratory distress.  Abdominal:     General: Abdomen is flat. There is no distension.     Tenderness: There is no abdominal tenderness.  Musculoskeletal:     Cervical back: No rigidity.     Right lower leg: No edema.     Left lower leg: No edema.     Comments: BLUE BLLE NVI Full rom to RUE Radial pulses intact b/l No joint swelling or erythema or rashes   Skin:    General: Skin is warm and dry.     Capillary Refill: Capillary refill takes less than 2 seconds.  Neurological:     General: No focal deficit present.     Mental Status: He is alert and oriented to person, place, and time.     GCS: GCS eye subscore is 4. GCS verbal subscore is 5. GCS motor subscore is 6.     Cranial Nerves: Cranial nerves 2-12 are intact.     Sensory: Sensation is intact.     Motor: Motor function is intact.     Coordination: Coordination is intact.     Comments: Strength 5/5 blue blle  Psychiatric:        Mood and Affect: Mood normal.        Behavior: Behavior normal.     ED Results and Treatments Labs (all labs ordered are listed, but only abnormal results are displayed) Labs Reviewed  CBC WITH DIFFERENTIAL/PLATELET - Abnormal; Notable for the following components:      Result Value   WBC  10.8 (*)    Monocytes Absolute 1.1 (*)    All other components within normal limits   BASIC METABOLIC PANEL  MAGNESIUM                                                                                                                          Radiology No results found.  Pertinent labs & imaging results that were available during my care of the patient were reviewed by me and considered in my medical decision making (see MDM for details).  Medications Ordered in ED Medications  ketorolac (TORADOL) injection 30 mg (30 mg Intramuscular Given 08/07/22 1458)  acetaminophen (TYLENOL) tablet 1,000 mg (1,000 mg Oral Given 08/07/22 1458)                                                                                                                                     Procedures Procedures  (including critical care time)  Medical Decision Making / ED Course    Medical Decision Making:    Thuan Sheldrick is a 32 y.o. male hx asthma here for right arm/foot pain tingling. The complaint involves an extensive differential diagnosis and also carries with it a high risk of complications and morbidity.  Serious etiology was considered. Ddx includes but is not limited to: metabolic, electrolyte, nerve issue, msk, strain, neuropathy, etc  Complete initial physical exam performed, notably the patient  was nad, resting on stretcher watching tv.    Reviewed and confirmed nursing documentation for past medical history, family history, social history.  Vital signs reviewed.    Clinical Course as of 08/16/22 0758  Sat Aug 07, 2022  1454 Labs stable [SG]  1523 X-ray reviewed and stable.  EKG stable.  He is feeling better on recheck.  Will give postop shoe and crutches.  Concern for possible MSK injury. [SG]    Clinical Course User Index [SG] Sloan Leiter, DO   Neuro exam is non-focal Stroke unlikely  Labs stable Imaging stable He is feeling better Favor msk etiology Recommend RICE, supportive care at home Given crutches F/u with pcp  The patient improved significantly and was  discharged in stable condition. Detailed discussions were had with the patient regarding current findings, and need for close f/u with PCP or on call doctor. The patient has been instructed to return immediately if the symptoms worsen in any way for re-evaluation. Patient verbalized  understanding and is in agreement with current care plan. All questions answered prior to discharge.     Additional history obtained: -Additional history obtained from na -External records from outside source obtained and reviewed including: Chart review including previous notes, labs, imaging, consultation notes including prior ed visits, prior labs/imaging   Lab Tests: -I ordered, reviewed, and interpreted labs.   The pertinent results include:   Labs Reviewed  CBC WITH DIFFERENTIAL/PLATELET - Abnormal; Notable for the following components:      Result Value   WBC 10.8 (*)    Monocytes Absolute 1.1 (*)    All other components within normal limits  BASIC METABOLIC PANEL  MAGNESIUM    Notable for labs stable  EKG   EKG Interpretation Date/Time:  Saturday August 07 2022 11:46:35 EDT Ventricular Rate:  99 PR Interval:  150 QRS Duration:  76 QT Interval:  319 QTC Calculation: 410 R Axis:   47  Text Interpretation: Sinus rhythm Borderline T abnormalities, inferior leads Confirmed by Tilden Fossa [phone]) on 08/08/2022 3:56:09 PM         Imaging Studies ordered: I ordered imaging studies including foot xr I independently visualized the following imaging with scope of interpretation limited to determining acute life threatening conditions related to emergency care; findings noted above, significant for no fx I independently visualized and interpreted imaging. I agree with the radiologist interpretation   Medicines ordered and prescription drug management: Meds ordered this encounter  Medications   ketorolac (TORADOL) injection 30 mg   acetaminophen (TYLENOL) tablet 1,000 mg   oxyCODONE  (ROXICODONE) 5 MG immediate release tablet    Sig: Take 1 tablet (5 mg total) by mouth every 6 (six) hours as needed for severe pain.    Dispense:  5 tablet    Refill:  0   acetaminophen (TYLENOL) 325 MG tablet    Sig: Take 2 tablets (650 mg total) by mouth every 6 (six) hours as needed.    Dispense:  36 tablet    Refill:  0   ibuprofen (ADVIL) 600 MG tablet    Sig: Take 1 tablet (600 mg total) by mouth every 6 (six) hours as needed.    Dispense:  30 tablet    Refill:  0    -I have reviewed the patients home medicines and have made adjustments as needed   Consultations Obtained: na   Cardiac Monitoring: na  Social Determinants of Health:  Diagnosis or treatment significantly limited by social determinants of health: current smoker Counseled patient for approximately 2 minutes regarding smoking cessation. Discussed risks of smoking and how they applied and affected their visit here today. Patient not ready to quit at this time, however will follow up with their primary doctor when they are.   CPT code: 60454: intermediate counseling for smoking cessation     Reevaluation: After the interventions noted above, I reevaluated the patient and found that they have improved  Co morbidities that complicate the patient evaluation  Past Medical History:  Diagnosis Date   Asthma       Dispostion: Disposition decision including need for hospitalization was considered, and patient discharged from emergency department.    Final Clinical Impression(s) / ED Diagnoses Final diagnoses:  Right foot pain     This chart was dictated using voice recognition software.  Despite best efforts to proofread,  errors can occur which can change the documentation meaning.    Sloan Leiter, DO 08/16/22 [phone]
